# Patient Record
Sex: Male | Born: 1960
Health system: Southern US, Community
[De-identification: ages and names within clinical notes are randomized; demographics above are authoritative.]

---

## 2003-08-03 ENCOUNTER — Inpatient Hospital Stay (HOSPITAL_COMMUNITY): Admission: RE | Admit: 2003-08-03 | Discharge: 2003-08-12 | Payer: Self-pay | Admitting: Psychiatry

## 2011-06-02 DIAGNOSIS — Z23 Encounter for immunization: Secondary | ICD-10-CM | POA: Diagnosis not present

## 2011-06-02 DIAGNOSIS — E039 Hypothyroidism, unspecified: Secondary | ICD-10-CM | POA: Diagnosis not present

## 2011-06-02 DIAGNOSIS — E7800 Pure hypercholesterolemia, unspecified: Secondary | ICD-10-CM | POA: Diagnosis not present

## 2011-06-02 DIAGNOSIS — E119 Type 2 diabetes mellitus without complications: Secondary | ICD-10-CM | POA: Diagnosis not present

## 2011-06-02 DIAGNOSIS — Z6841 Body Mass Index (BMI) 40.0 and over, adult: Secondary | ICD-10-CM | POA: Diagnosis not present

## 2011-06-02 DIAGNOSIS — I10 Essential (primary) hypertension: Secondary | ICD-10-CM | POA: Diagnosis not present

## 2011-06-02 DIAGNOSIS — E781 Pure hyperglyceridemia: Secondary | ICD-10-CM | POA: Diagnosis not present

## 2011-08-02 DIAGNOSIS — E119 Type 2 diabetes mellitus without complications: Secondary | ICD-10-CM | POA: Diagnosis not present

## 2011-08-02 DIAGNOSIS — E781 Pure hyperglyceridemia: Secondary | ICD-10-CM | POA: Diagnosis not present

## 2011-08-02 DIAGNOSIS — E039 Hypothyroidism, unspecified: Secondary | ICD-10-CM | POA: Diagnosis not present

## 2011-08-02 DIAGNOSIS — I10 Essential (primary) hypertension: Secondary | ICD-10-CM | POA: Diagnosis not present

## 2011-10-03 DIAGNOSIS — I10 Essential (primary) hypertension: Secondary | ICD-10-CM | POA: Diagnosis not present

## 2011-10-03 DIAGNOSIS — E039 Hypothyroidism, unspecified: Secondary | ICD-10-CM | POA: Diagnosis not present

## 2011-10-03 DIAGNOSIS — M25519 Pain in unspecified shoulder: Secondary | ICD-10-CM | POA: Diagnosis not present

## 2011-10-03 DIAGNOSIS — E781 Pure hyperglyceridemia: Secondary | ICD-10-CM | POA: Diagnosis not present

## 2011-10-03 DIAGNOSIS — E119 Type 2 diabetes mellitus without complications: Secondary | ICD-10-CM | POA: Diagnosis not present

## 2011-12-13 DIAGNOSIS — E781 Pure hyperglyceridemia: Secondary | ICD-10-CM | POA: Diagnosis not present

## 2011-12-13 DIAGNOSIS — I10 Essential (primary) hypertension: Secondary | ICD-10-CM | POA: Diagnosis not present

## 2011-12-13 DIAGNOSIS — E039 Hypothyroidism, unspecified: Secondary | ICD-10-CM | POA: Diagnosis not present

## 2011-12-13 DIAGNOSIS — E119 Type 2 diabetes mellitus without complications: Secondary | ICD-10-CM | POA: Diagnosis not present

## 2012-02-13 DIAGNOSIS — E119 Type 2 diabetes mellitus without complications: Secondary | ICD-10-CM | POA: Diagnosis not present

## 2012-02-13 DIAGNOSIS — E781 Pure hyperglyceridemia: Secondary | ICD-10-CM | POA: Diagnosis not present

## 2012-02-13 DIAGNOSIS — Z23 Encounter for immunization: Secondary | ICD-10-CM | POA: Diagnosis not present

## 2012-02-13 DIAGNOSIS — Z6841 Body Mass Index (BMI) 40.0 and over, adult: Secondary | ICD-10-CM | POA: Diagnosis not present

## 2012-02-13 DIAGNOSIS — I10 Essential (primary) hypertension: Secondary | ICD-10-CM | POA: Diagnosis not present

## 2012-03-19 DIAGNOSIS — Z0000 Encounter for general adult medical examination without abnormal findings: Secondary | ICD-10-CM | POA: Diagnosis not present

## 2012-03-19 DIAGNOSIS — Z125 Encounter for screening for malignant neoplasm of prostate: Secondary | ICD-10-CM | POA: Diagnosis not present

## 2012-05-21 DIAGNOSIS — I10 Essential (primary) hypertension: Secondary | ICD-10-CM | POA: Diagnosis not present

## 2012-05-21 DIAGNOSIS — E119 Type 2 diabetes mellitus without complications: Secondary | ICD-10-CM | POA: Diagnosis not present

## 2012-05-21 DIAGNOSIS — E781 Pure hyperglyceridemia: Secondary | ICD-10-CM | POA: Diagnosis not present

## 2012-05-21 DIAGNOSIS — Z6841 Body Mass Index (BMI) 40.0 and over, adult: Secondary | ICD-10-CM | POA: Diagnosis not present

## 2012-07-19 DIAGNOSIS — E039 Hypothyroidism, unspecified: Secondary | ICD-10-CM | POA: Diagnosis not present

## 2012-07-19 DIAGNOSIS — E119 Type 2 diabetes mellitus without complications: Secondary | ICD-10-CM | POA: Diagnosis not present

## 2012-07-19 DIAGNOSIS — I10 Essential (primary) hypertension: Secondary | ICD-10-CM | POA: Diagnosis not present

## 2012-07-19 DIAGNOSIS — E781 Pure hyperglyceridemia: Secondary | ICD-10-CM | POA: Diagnosis not present

## 2012-07-19 DIAGNOSIS — Z6841 Body Mass Index (BMI) 40.0 and over, adult: Secondary | ICD-10-CM | POA: Diagnosis not present

## 2012-07-30 DIAGNOSIS — E119 Type 2 diabetes mellitus without complications: Secondary | ICD-10-CM | POA: Diagnosis not present

## 2012-09-25 DIAGNOSIS — I10 Essential (primary) hypertension: Secondary | ICD-10-CM | POA: Diagnosis not present

## 2012-09-25 DIAGNOSIS — E039 Hypothyroidism, unspecified: Secondary | ICD-10-CM | POA: Diagnosis not present

## 2012-09-25 DIAGNOSIS — E781 Pure hyperglyceridemia: Secondary | ICD-10-CM | POA: Diagnosis not present

## 2012-09-25 DIAGNOSIS — Z6841 Body Mass Index (BMI) 40.0 and over, adult: Secondary | ICD-10-CM | POA: Diagnosis not present

## 2012-09-25 DIAGNOSIS — E119 Type 2 diabetes mellitus without complications: Secondary | ICD-10-CM | POA: Diagnosis not present

## 2012-10-03 DIAGNOSIS — I10 Essential (primary) hypertension: Secondary | ICD-10-CM | POA: Diagnosis not present

## 2012-10-03 DIAGNOSIS — E781 Pure hyperglyceridemia: Secondary | ICD-10-CM | POA: Diagnosis not present

## 2012-10-03 DIAGNOSIS — Z6841 Body Mass Index (BMI) 40.0 and over, adult: Secondary | ICD-10-CM | POA: Diagnosis not present

## 2012-10-03 DIAGNOSIS — E119 Type 2 diabetes mellitus without complications: Secondary | ICD-10-CM | POA: Diagnosis not present

## 2012-10-16 DIAGNOSIS — E781 Pure hyperglyceridemia: Secondary | ICD-10-CM | POA: Diagnosis not present

## 2012-10-16 DIAGNOSIS — E119 Type 2 diabetes mellitus without complications: Secondary | ICD-10-CM | POA: Diagnosis not present

## 2012-10-16 DIAGNOSIS — I10 Essential (primary) hypertension: Secondary | ICD-10-CM | POA: Diagnosis not present

## 2012-10-16 DIAGNOSIS — E039 Hypothyroidism, unspecified: Secondary | ICD-10-CM | POA: Diagnosis not present

## 2012-10-29 DIAGNOSIS — I10 Essential (primary) hypertension: Secondary | ICD-10-CM | POA: Diagnosis not present

## 2012-10-29 DIAGNOSIS — Z6841 Body Mass Index (BMI) 40.0 and over, adult: Secondary | ICD-10-CM | POA: Diagnosis not present

## 2012-10-29 DIAGNOSIS — E781 Pure hyperglyceridemia: Secondary | ICD-10-CM | POA: Diagnosis not present

## 2012-10-29 DIAGNOSIS — E119 Type 2 diabetes mellitus without complications: Secondary | ICD-10-CM | POA: Diagnosis not present

## 2012-11-21 DIAGNOSIS — Z6839 Body mass index (BMI) 39.0-39.9, adult: Secondary | ICD-10-CM | POA: Diagnosis not present

## 2012-11-21 DIAGNOSIS — E781 Pure hyperglyceridemia: Secondary | ICD-10-CM | POA: Diagnosis not present

## 2012-11-21 DIAGNOSIS — R197 Diarrhea, unspecified: Secondary | ICD-10-CM | POA: Diagnosis not present

## 2012-11-21 DIAGNOSIS — E119 Type 2 diabetes mellitus without complications: Secondary | ICD-10-CM | POA: Diagnosis not present

## 2012-11-27 DIAGNOSIS — E781 Pure hyperglyceridemia: Secondary | ICD-10-CM | POA: Diagnosis not present

## 2012-11-27 DIAGNOSIS — E119 Type 2 diabetes mellitus without complications: Secondary | ICD-10-CM | POA: Diagnosis not present

## 2012-11-27 DIAGNOSIS — R197 Diarrhea, unspecified: Secondary | ICD-10-CM | POA: Diagnosis not present

## 2012-11-27 DIAGNOSIS — Z6841 Body Mass Index (BMI) 40.0 and over, adult: Secondary | ICD-10-CM | POA: Diagnosis not present

## 2013-01-01 DIAGNOSIS — K5900 Constipation, unspecified: Secondary | ICD-10-CM | POA: Diagnosis not present

## 2013-01-01 DIAGNOSIS — Z6841 Body Mass Index (BMI) 40.0 and over, adult: Secondary | ICD-10-CM | POA: Diagnosis not present

## 2013-01-01 DIAGNOSIS — E781 Pure hyperglyceridemia: Secondary | ICD-10-CM | POA: Diagnosis not present

## 2013-01-01 DIAGNOSIS — E119 Type 2 diabetes mellitus without complications: Secondary | ICD-10-CM | POA: Diagnosis not present

## 2013-01-15 DIAGNOSIS — I10 Essential (primary) hypertension: Secondary | ICD-10-CM | POA: Diagnosis not present

## 2013-01-15 DIAGNOSIS — E039 Hypothyroidism, unspecified: Secondary | ICD-10-CM | POA: Diagnosis not present

## 2013-01-15 DIAGNOSIS — E119 Type 2 diabetes mellitus without complications: Secondary | ICD-10-CM | POA: Diagnosis not present

## 2013-01-15 DIAGNOSIS — Z6841 Body Mass Index (BMI) 40.0 and over, adult: Secondary | ICD-10-CM | POA: Diagnosis not present

## 2013-01-15 DIAGNOSIS — E781 Pure hyperglyceridemia: Secondary | ICD-10-CM | POA: Diagnosis not present

## 2013-02-12 DIAGNOSIS — E781 Pure hyperglyceridemia: Secondary | ICD-10-CM | POA: Diagnosis not present

## 2013-02-12 DIAGNOSIS — E119 Type 2 diabetes mellitus without complications: Secondary | ICD-10-CM | POA: Diagnosis not present

## 2013-02-12 DIAGNOSIS — Z23 Encounter for immunization: Secondary | ICD-10-CM | POA: Diagnosis not present

## 2013-02-12 DIAGNOSIS — Z6841 Body Mass Index (BMI) 40.0 and over, adult: Secondary | ICD-10-CM | POA: Diagnosis not present

## 2013-02-12 DIAGNOSIS — R300 Dysuria: Secondary | ICD-10-CM | POA: Diagnosis not present

## 2013-04-09 DIAGNOSIS — E781 Pure hyperglyceridemia: Secondary | ICD-10-CM | POA: Diagnosis not present

## 2013-04-09 DIAGNOSIS — E119 Type 2 diabetes mellitus without complications: Secondary | ICD-10-CM | POA: Diagnosis not present

## 2013-04-09 DIAGNOSIS — N19 Unspecified kidney failure: Secondary | ICD-10-CM | POA: Diagnosis not present

## 2013-04-09 DIAGNOSIS — I10 Essential (primary) hypertension: Secondary | ICD-10-CM | POA: Diagnosis not present

## 2013-05-27 DIAGNOSIS — F209 Schizophrenia, unspecified: Secondary | ICD-10-CM | POA: Diagnosis not present

## 2013-06-03 DIAGNOSIS — F209 Schizophrenia, unspecified: Secondary | ICD-10-CM | POA: Diagnosis not present

## 2013-06-11 DIAGNOSIS — E781 Pure hyperglyceridemia: Secondary | ICD-10-CM | POA: Diagnosis not present

## 2013-06-11 DIAGNOSIS — E119 Type 2 diabetes mellitus without complications: Secondary | ICD-10-CM | POA: Diagnosis not present

## 2013-06-11 DIAGNOSIS — N19 Unspecified kidney failure: Secondary | ICD-10-CM | POA: Diagnosis not present

## 2013-06-11 DIAGNOSIS — I10 Essential (primary) hypertension: Secondary | ICD-10-CM | POA: Diagnosis not present

## 2013-06-11 DIAGNOSIS — R197 Diarrhea, unspecified: Secondary | ICD-10-CM | POA: Diagnosis not present

## 2013-07-02 DIAGNOSIS — F209 Schizophrenia, unspecified: Secondary | ICD-10-CM | POA: Diagnosis not present

## 2013-07-10 DIAGNOSIS — F209 Schizophrenia, unspecified: Secondary | ICD-10-CM | POA: Diagnosis not present

## 2013-07-16 DIAGNOSIS — E039 Hypothyroidism, unspecified: Secondary | ICD-10-CM | POA: Diagnosis not present

## 2013-07-16 DIAGNOSIS — I10 Essential (primary) hypertension: Secondary | ICD-10-CM | POA: Diagnosis not present

## 2013-07-16 DIAGNOSIS — E119 Type 2 diabetes mellitus without complications: Secondary | ICD-10-CM | POA: Diagnosis not present

## 2013-07-16 DIAGNOSIS — N19 Unspecified kidney failure: Secondary | ICD-10-CM | POA: Diagnosis not present

## 2013-07-16 DIAGNOSIS — Z6841 Body Mass Index (BMI) 40.0 and over, adult: Secondary | ICD-10-CM | POA: Diagnosis not present

## 2013-07-16 DIAGNOSIS — E781 Pure hyperglyceridemia: Secondary | ICD-10-CM | POA: Diagnosis not present

## 2013-07-22 DIAGNOSIS — F209 Schizophrenia, unspecified: Secondary | ICD-10-CM | POA: Diagnosis not present

## 2013-08-05 DIAGNOSIS — E781 Pure hyperglyceridemia: Secondary | ICD-10-CM | POA: Diagnosis not present

## 2013-08-05 DIAGNOSIS — I10 Essential (primary) hypertension: Secondary | ICD-10-CM | POA: Diagnosis not present

## 2013-08-05 DIAGNOSIS — E039 Hypothyroidism, unspecified: Secondary | ICD-10-CM | POA: Diagnosis not present

## 2013-08-05 DIAGNOSIS — N19 Unspecified kidney failure: Secondary | ICD-10-CM | POA: Diagnosis not present

## 2013-08-06 DIAGNOSIS — F209 Schizophrenia, unspecified: Secondary | ICD-10-CM | POA: Diagnosis not present

## 2013-08-13 DIAGNOSIS — F209 Schizophrenia, unspecified: Secondary | ICD-10-CM | POA: Diagnosis not present

## 2013-08-26 DIAGNOSIS — F209 Schizophrenia, unspecified: Secondary | ICD-10-CM | POA: Diagnosis not present

## 2013-09-02 DIAGNOSIS — I10 Essential (primary) hypertension: Secondary | ICD-10-CM | POA: Diagnosis not present

## 2013-09-02 DIAGNOSIS — E119 Type 2 diabetes mellitus without complications: Secondary | ICD-10-CM | POA: Diagnosis not present

## 2013-09-02 DIAGNOSIS — E781 Pure hyperglyceridemia: Secondary | ICD-10-CM | POA: Diagnosis not present

## 2013-09-02 DIAGNOSIS — E039 Hypothyroidism, unspecified: Secondary | ICD-10-CM | POA: Diagnosis not present

## 2013-09-24 DIAGNOSIS — F209 Schizophrenia, unspecified: Secondary | ICD-10-CM | POA: Diagnosis not present

## 2013-10-03 DIAGNOSIS — M25519 Pain in unspecified shoulder: Secondary | ICD-10-CM | POA: Diagnosis not present

## 2013-10-03 DIAGNOSIS — E119 Type 2 diabetes mellitus without complications: Secondary | ICD-10-CM | POA: Diagnosis not present

## 2013-10-03 DIAGNOSIS — E781 Pure hyperglyceridemia: Secondary | ICD-10-CM | POA: Diagnosis not present

## 2013-10-03 DIAGNOSIS — E039 Hypothyroidism, unspecified: Secondary | ICD-10-CM | POA: Diagnosis not present

## 2013-10-03 DIAGNOSIS — I10 Essential (primary) hypertension: Secondary | ICD-10-CM | POA: Diagnosis not present

## 2013-10-03 DIAGNOSIS — Z6841 Body Mass Index (BMI) 40.0 and over, adult: Secondary | ICD-10-CM | POA: Diagnosis not present

## 2013-10-22 DIAGNOSIS — F209 Schizophrenia, unspecified: Secondary | ICD-10-CM | POA: Diagnosis not present

## 2013-11-04 DIAGNOSIS — E119 Type 2 diabetes mellitus without complications: Secondary | ICD-10-CM | POA: Diagnosis not present

## 2013-11-04 DIAGNOSIS — E039 Hypothyroidism, unspecified: Secondary | ICD-10-CM | POA: Diagnosis not present

## 2013-11-04 DIAGNOSIS — E781 Pure hyperglyceridemia: Secondary | ICD-10-CM | POA: Diagnosis not present

## 2013-11-04 DIAGNOSIS — I10 Essential (primary) hypertension: Secondary | ICD-10-CM | POA: Diagnosis not present

## 2013-12-16 DIAGNOSIS — I10 Essential (primary) hypertension: Secondary | ICD-10-CM | POA: Diagnosis not present

## 2013-12-16 DIAGNOSIS — E119 Type 2 diabetes mellitus without complications: Secondary | ICD-10-CM | POA: Diagnosis not present

## 2013-12-16 DIAGNOSIS — E781 Pure hyperglyceridemia: Secondary | ICD-10-CM | POA: Diagnosis not present

## 2013-12-16 DIAGNOSIS — E039 Hypothyroidism, unspecified: Secondary | ICD-10-CM | POA: Diagnosis not present

## 2013-12-17 DIAGNOSIS — F209 Schizophrenia, unspecified: Secondary | ICD-10-CM | POA: Diagnosis not present

## 2014-01-27 DIAGNOSIS — E119 Type 2 diabetes mellitus without complications: Secondary | ICD-10-CM | POA: Diagnosis not present

## 2014-01-27 DIAGNOSIS — E039 Hypothyroidism, unspecified: Secondary | ICD-10-CM | POA: Diagnosis not present

## 2014-01-27 DIAGNOSIS — I10 Essential (primary) hypertension: Secondary | ICD-10-CM | POA: Diagnosis not present

## 2014-01-27 DIAGNOSIS — E7800 Pure hypercholesterolemia, unspecified: Secondary | ICD-10-CM | POA: Diagnosis not present

## 2014-01-27 DIAGNOSIS — Z23 Encounter for immunization: Secondary | ICD-10-CM | POA: Diagnosis not present

## 2014-02-17 DIAGNOSIS — F209 Schizophrenia, unspecified: Secondary | ICD-10-CM | POA: Diagnosis not present

## 2014-03-10 DIAGNOSIS — E119 Type 2 diabetes mellitus without complications: Secondary | ICD-10-CM | POA: Diagnosis not present

## 2014-03-10 DIAGNOSIS — I10 Essential (primary) hypertension: Secondary | ICD-10-CM | POA: Diagnosis not present

## 2014-03-10 DIAGNOSIS — Z6841 Body Mass Index (BMI) 40.0 and over, adult: Secondary | ICD-10-CM | POA: Diagnosis not present

## 2014-03-10 DIAGNOSIS — N289 Disorder of kidney and ureter, unspecified: Secondary | ICD-10-CM | POA: Diagnosis not present

## 2014-03-10 DIAGNOSIS — K5909 Other constipation: Secondary | ICD-10-CM | POA: Diagnosis not present

## 2014-03-10 DIAGNOSIS — E781 Pure hyperglyceridemia: Secondary | ICD-10-CM | POA: Diagnosis not present

## 2014-03-10 DIAGNOSIS — E039 Hypothyroidism, unspecified: Secondary | ICD-10-CM | POA: Diagnosis not present

## 2014-03-24 DIAGNOSIS — F209 Schizophrenia, unspecified: Secondary | ICD-10-CM | POA: Diagnosis not present

## 2014-04-10 DIAGNOSIS — E119 Type 2 diabetes mellitus without complications: Secondary | ICD-10-CM | POA: Diagnosis not present

## 2014-04-10 DIAGNOSIS — I10 Essential (primary) hypertension: Secondary | ICD-10-CM | POA: Diagnosis not present

## 2014-04-10 DIAGNOSIS — M25519 Pain in unspecified shoulder: Secondary | ICD-10-CM | POA: Diagnosis not present

## 2014-04-10 DIAGNOSIS — K5909 Other constipation: Secondary | ICD-10-CM | POA: Diagnosis not present

## 2014-04-10 DIAGNOSIS — E781 Pure hyperglyceridemia: Secondary | ICD-10-CM | POA: Diagnosis not present

## 2014-04-10 DIAGNOSIS — E039 Hypothyroidism, unspecified: Secondary | ICD-10-CM | POA: Diagnosis not present

## 2014-04-10 DIAGNOSIS — N289 Disorder of kidney and ureter, unspecified: Secondary | ICD-10-CM | POA: Diagnosis not present

## 2014-05-13 DIAGNOSIS — N289 Disorder of kidney and ureter, unspecified: Secondary | ICD-10-CM | POA: Diagnosis not present

## 2014-05-13 DIAGNOSIS — E781 Pure hyperglyceridemia: Secondary | ICD-10-CM | POA: Diagnosis not present

## 2014-05-13 DIAGNOSIS — I10 Essential (primary) hypertension: Secondary | ICD-10-CM | POA: Diagnosis not present

## 2014-05-13 DIAGNOSIS — E039 Hypothyroidism, unspecified: Secondary | ICD-10-CM | POA: Diagnosis not present

## 2014-05-13 DIAGNOSIS — Z6841 Body Mass Index (BMI) 40.0 and over, adult: Secondary | ICD-10-CM | POA: Diagnosis not present

## 2014-05-13 DIAGNOSIS — E119 Type 2 diabetes mellitus without complications: Secondary | ICD-10-CM | POA: Diagnosis not present

## 2014-05-13 DIAGNOSIS — K5909 Other constipation: Secondary | ICD-10-CM | POA: Diagnosis not present

## 2014-06-10 DIAGNOSIS — E119 Type 2 diabetes mellitus without complications: Secondary | ICD-10-CM | POA: Diagnosis not present

## 2014-06-10 DIAGNOSIS — E781 Pure hyperglyceridemia: Secondary | ICD-10-CM | POA: Diagnosis not present

## 2014-06-10 DIAGNOSIS — E039 Hypothyroidism, unspecified: Secondary | ICD-10-CM | POA: Diagnosis not present

## 2014-06-10 DIAGNOSIS — M25519 Pain in unspecified shoulder: Secondary | ICD-10-CM | POA: Diagnosis not present

## 2014-06-10 DIAGNOSIS — I10 Essential (primary) hypertension: Secondary | ICD-10-CM | POA: Diagnosis not present

## 2014-06-10 DIAGNOSIS — K5909 Other constipation: Secondary | ICD-10-CM | POA: Diagnosis not present

## 2014-06-10 DIAGNOSIS — Z6841 Body Mass Index (BMI) 40.0 and over, adult: Secondary | ICD-10-CM | POA: Diagnosis not present

## 2014-06-10 DIAGNOSIS — N289 Disorder of kidney and ureter, unspecified: Secondary | ICD-10-CM | POA: Diagnosis not present

## 2014-06-23 DIAGNOSIS — F209 Schizophrenia, unspecified: Secondary | ICD-10-CM | POA: Diagnosis not present

## 2014-07-21 DIAGNOSIS — F209 Schizophrenia, unspecified: Secondary | ICD-10-CM | POA: Diagnosis not present

## 2014-08-04 DIAGNOSIS — I10 Essential (primary) hypertension: Secondary | ICD-10-CM | POA: Diagnosis not present

## 2014-08-04 DIAGNOSIS — N289 Disorder of kidney and ureter, unspecified: Secondary | ICD-10-CM | POA: Diagnosis not present

## 2014-08-04 DIAGNOSIS — E781 Pure hyperglyceridemia: Secondary | ICD-10-CM | POA: Diagnosis not present

## 2014-08-04 DIAGNOSIS — E039 Hypothyroidism, unspecified: Secondary | ICD-10-CM | POA: Diagnosis not present

## 2014-08-04 DIAGNOSIS — E119 Type 2 diabetes mellitus without complications: Secondary | ICD-10-CM | POA: Diagnosis not present

## 2014-08-04 DIAGNOSIS — K5909 Other constipation: Secondary | ICD-10-CM | POA: Diagnosis not present

## 2014-08-04 DIAGNOSIS — Z6841 Body Mass Index (BMI) 40.0 and over, adult: Secondary | ICD-10-CM | POA: Diagnosis not present

## 2014-09-01 DIAGNOSIS — E781 Pure hyperglyceridemia: Secondary | ICD-10-CM | POA: Diagnosis not present

## 2014-09-01 DIAGNOSIS — K5909 Other constipation: Secondary | ICD-10-CM | POA: Diagnosis not present

## 2014-09-01 DIAGNOSIS — Z9181 History of falling: Secondary | ICD-10-CM | POA: Diagnosis not present

## 2014-09-01 DIAGNOSIS — E039 Hypothyroidism, unspecified: Secondary | ICD-10-CM | POA: Diagnosis not present

## 2014-09-01 DIAGNOSIS — I10 Essential (primary) hypertension: Secondary | ICD-10-CM | POA: Diagnosis not present

## 2014-09-01 DIAGNOSIS — E119 Type 2 diabetes mellitus without complications: Secondary | ICD-10-CM | POA: Diagnosis not present

## 2014-09-01 DIAGNOSIS — Z6841 Body Mass Index (BMI) 40.0 and over, adult: Secondary | ICD-10-CM | POA: Diagnosis not present

## 2014-09-01 DIAGNOSIS — Z1389 Encounter for screening for other disorder: Secondary | ICD-10-CM | POA: Diagnosis not present

## 2014-09-01 DIAGNOSIS — N289 Disorder of kidney and ureter, unspecified: Secondary | ICD-10-CM | POA: Diagnosis not present

## 2014-09-22 DIAGNOSIS — F209 Schizophrenia, unspecified: Secondary | ICD-10-CM | POA: Diagnosis not present

## 2014-10-02 DIAGNOSIS — E039 Hypothyroidism, unspecified: Secondary | ICD-10-CM | POA: Diagnosis not present

## 2014-10-02 DIAGNOSIS — K5909 Other constipation: Secondary | ICD-10-CM | POA: Diagnosis not present

## 2014-10-02 DIAGNOSIS — E781 Pure hyperglyceridemia: Secondary | ICD-10-CM | POA: Diagnosis not present

## 2014-10-02 DIAGNOSIS — I10 Essential (primary) hypertension: Secondary | ICD-10-CM | POA: Diagnosis not present

## 2014-10-02 DIAGNOSIS — N289 Disorder of kidney and ureter, unspecified: Secondary | ICD-10-CM | POA: Diagnosis not present

## 2014-10-02 DIAGNOSIS — E119 Type 2 diabetes mellitus without complications: Secondary | ICD-10-CM | POA: Diagnosis not present

## 2014-10-02 DIAGNOSIS — Z6841 Body Mass Index (BMI) 40.0 and over, adult: Secondary | ICD-10-CM | POA: Diagnosis not present

## 2014-11-24 DIAGNOSIS — F209 Schizophrenia, unspecified: Secondary | ICD-10-CM | POA: Diagnosis not present

## 2014-12-02 DIAGNOSIS — K5909 Other constipation: Secondary | ICD-10-CM | POA: Diagnosis not present

## 2014-12-02 DIAGNOSIS — E039 Hypothyroidism, unspecified: Secondary | ICD-10-CM | POA: Diagnosis not present

## 2014-12-02 DIAGNOSIS — E781 Pure hyperglyceridemia: Secondary | ICD-10-CM | POA: Diagnosis not present

## 2014-12-02 DIAGNOSIS — E119 Type 2 diabetes mellitus without complications: Secondary | ICD-10-CM | POA: Diagnosis not present

## 2014-12-02 DIAGNOSIS — N289 Disorder of kidney and ureter, unspecified: Secondary | ICD-10-CM | POA: Diagnosis not present

## 2014-12-02 DIAGNOSIS — Z6841 Body Mass Index (BMI) 40.0 and over, adult: Secondary | ICD-10-CM | POA: Diagnosis not present

## 2014-12-02 DIAGNOSIS — I10 Essential (primary) hypertension: Secondary | ICD-10-CM | POA: Diagnosis not present

## 2015-01-05 DIAGNOSIS — I10 Essential (primary) hypertension: Secondary | ICD-10-CM | POA: Diagnosis not present

## 2015-01-05 DIAGNOSIS — E781 Pure hyperglyceridemia: Secondary | ICD-10-CM | POA: Diagnosis not present

## 2015-01-05 DIAGNOSIS — K5909 Other constipation: Secondary | ICD-10-CM | POA: Diagnosis not present

## 2015-01-05 DIAGNOSIS — E119 Type 2 diabetes mellitus without complications: Secondary | ICD-10-CM | POA: Diagnosis not present

## 2015-01-05 DIAGNOSIS — Z6841 Body Mass Index (BMI) 40.0 and over, adult: Secondary | ICD-10-CM | POA: Diagnosis not present

## 2015-01-05 DIAGNOSIS — N289 Disorder of kidney and ureter, unspecified: Secondary | ICD-10-CM | POA: Diagnosis not present

## 2015-01-05 DIAGNOSIS — E039 Hypothyroidism, unspecified: Secondary | ICD-10-CM | POA: Diagnosis not present

## 2015-01-07 DIAGNOSIS — E1165 Type 2 diabetes mellitus with hyperglycemia: Secondary | ICD-10-CM | POA: Diagnosis not present

## 2015-01-08 DIAGNOSIS — E1165 Type 2 diabetes mellitus with hyperglycemia: Secondary | ICD-10-CM | POA: Diagnosis not present

## 2015-01-08 DIAGNOSIS — R531 Weakness: Secondary | ICD-10-CM | POA: Diagnosis not present

## 2015-01-13 DIAGNOSIS — F209 Schizophrenia, unspecified: Secondary | ICD-10-CM | POA: Diagnosis not present

## 2015-01-20 DIAGNOSIS — E1165 Type 2 diabetes mellitus with hyperglycemia: Secondary | ICD-10-CM | POA: Diagnosis not present

## 2015-01-27 DIAGNOSIS — N289 Disorder of kidney and ureter, unspecified: Secondary | ICD-10-CM | POA: Diagnosis not present

## 2015-01-27 DIAGNOSIS — I10 Essential (primary) hypertension: Secondary | ICD-10-CM | POA: Diagnosis not present

## 2015-01-27 DIAGNOSIS — Z23 Encounter for immunization: Secondary | ICD-10-CM | POA: Diagnosis not present

## 2015-01-27 DIAGNOSIS — E781 Pure hyperglyceridemia: Secondary | ICD-10-CM | POA: Diagnosis not present

## 2015-01-27 DIAGNOSIS — K5909 Other constipation: Secondary | ICD-10-CM | POA: Diagnosis not present

## 2015-01-27 DIAGNOSIS — E119 Type 2 diabetes mellitus without complications: Secondary | ICD-10-CM | POA: Diagnosis not present

## 2015-01-27 DIAGNOSIS — E039 Hypothyroidism, unspecified: Secondary | ICD-10-CM | POA: Diagnosis not present

## 2015-01-27 DIAGNOSIS — Z6839 Body mass index (BMI) 39.0-39.9, adult: Secondary | ICD-10-CM | POA: Diagnosis not present

## 2015-02-03 DIAGNOSIS — E039 Hypothyroidism, unspecified: Secondary | ICD-10-CM | POA: Diagnosis not present

## 2015-02-03 DIAGNOSIS — N289 Disorder of kidney and ureter, unspecified: Secondary | ICD-10-CM | POA: Diagnosis not present

## 2015-02-03 DIAGNOSIS — E781 Pure hyperglyceridemia: Secondary | ICD-10-CM | POA: Diagnosis not present

## 2015-02-03 DIAGNOSIS — I10 Essential (primary) hypertension: Secondary | ICD-10-CM | POA: Diagnosis not present

## 2015-02-03 DIAGNOSIS — Z6839 Body mass index (BMI) 39.0-39.9, adult: Secondary | ICD-10-CM | POA: Diagnosis not present

## 2015-02-03 DIAGNOSIS — E119 Type 2 diabetes mellitus without complications: Secondary | ICD-10-CM | POA: Diagnosis not present

## 2015-02-03 DIAGNOSIS — K5909 Other constipation: Secondary | ICD-10-CM | POA: Diagnosis not present

## 2015-02-10 DIAGNOSIS — K5909 Other constipation: Secondary | ICD-10-CM | POA: Diagnosis not present

## 2015-02-10 DIAGNOSIS — Z6838 Body mass index (BMI) 38.0-38.9, adult: Secondary | ICD-10-CM | POA: Diagnosis not present

## 2015-02-10 DIAGNOSIS — I10 Essential (primary) hypertension: Secondary | ICD-10-CM | POA: Diagnosis not present

## 2015-02-10 DIAGNOSIS — E039 Hypothyroidism, unspecified: Secondary | ICD-10-CM | POA: Diagnosis not present

## 2015-02-10 DIAGNOSIS — E781 Pure hyperglyceridemia: Secondary | ICD-10-CM | POA: Diagnosis not present

## 2015-02-10 DIAGNOSIS — N289 Disorder of kidney and ureter, unspecified: Secondary | ICD-10-CM | POA: Diagnosis not present

## 2015-02-10 DIAGNOSIS — E119 Type 2 diabetes mellitus without complications: Secondary | ICD-10-CM | POA: Diagnosis not present

## 2015-02-24 DIAGNOSIS — Z6837 Body mass index (BMI) 37.0-37.9, adult: Secondary | ICD-10-CM | POA: Diagnosis not present

## 2015-02-24 DIAGNOSIS — E039 Hypothyroidism, unspecified: Secondary | ICD-10-CM | POA: Diagnosis not present

## 2015-02-24 DIAGNOSIS — N289 Disorder of kidney and ureter, unspecified: Secondary | ICD-10-CM | POA: Diagnosis not present

## 2015-02-24 DIAGNOSIS — E781 Pure hyperglyceridemia: Secondary | ICD-10-CM | POA: Diagnosis not present

## 2015-02-24 DIAGNOSIS — I10 Essential (primary) hypertension: Secondary | ICD-10-CM | POA: Diagnosis not present

## 2015-02-24 DIAGNOSIS — K5909 Other constipation: Secondary | ICD-10-CM | POA: Diagnosis not present

## 2015-02-24 DIAGNOSIS — E119 Type 2 diabetes mellitus without complications: Secondary | ICD-10-CM | POA: Diagnosis not present

## 2015-03-16 DIAGNOSIS — F209 Schizophrenia, unspecified: Secondary | ICD-10-CM | POA: Diagnosis not present

## 2015-03-23 DIAGNOSIS — E119 Type 2 diabetes mellitus without complications: Secondary | ICD-10-CM | POA: Diagnosis not present

## 2015-03-23 DIAGNOSIS — K5909 Other constipation: Secondary | ICD-10-CM | POA: Diagnosis not present

## 2015-03-23 DIAGNOSIS — N289 Disorder of kidney and ureter, unspecified: Secondary | ICD-10-CM | POA: Diagnosis not present

## 2015-03-23 DIAGNOSIS — Z1389 Encounter for screening for other disorder: Secondary | ICD-10-CM | POA: Diagnosis not present

## 2015-03-23 DIAGNOSIS — Z6837 Body mass index (BMI) 37.0-37.9, adult: Secondary | ICD-10-CM | POA: Diagnosis not present

## 2015-03-23 DIAGNOSIS — I10 Essential (primary) hypertension: Secondary | ICD-10-CM | POA: Diagnosis not present

## 2015-03-23 DIAGNOSIS — E039 Hypothyroidism, unspecified: Secondary | ICD-10-CM | POA: Diagnosis not present

## 2015-03-23 DIAGNOSIS — E781 Pure hyperglyceridemia: Secondary | ICD-10-CM | POA: Diagnosis not present

## 2015-04-20 DIAGNOSIS — N289 Disorder of kidney and ureter, unspecified: Secondary | ICD-10-CM | POA: Diagnosis not present

## 2015-04-20 DIAGNOSIS — E039 Hypothyroidism, unspecified: Secondary | ICD-10-CM | POA: Diagnosis not present

## 2015-04-20 DIAGNOSIS — E781 Pure hyperglyceridemia: Secondary | ICD-10-CM | POA: Diagnosis not present

## 2015-04-20 DIAGNOSIS — K5909 Other constipation: Secondary | ICD-10-CM | POA: Diagnosis not present

## 2015-04-20 DIAGNOSIS — Z6837 Body mass index (BMI) 37.0-37.9, adult: Secondary | ICD-10-CM | POA: Diagnosis not present

## 2015-04-20 DIAGNOSIS — I10 Essential (primary) hypertension: Secondary | ICD-10-CM | POA: Diagnosis not present

## 2015-04-20 DIAGNOSIS — E119 Type 2 diabetes mellitus without complications: Secondary | ICD-10-CM | POA: Diagnosis not present

## 2015-05-04 DIAGNOSIS — F209 Schizophrenia, unspecified: Secondary | ICD-10-CM | POA: Diagnosis not present

## 2015-05-18 DIAGNOSIS — I10 Essential (primary) hypertension: Secondary | ICD-10-CM | POA: Diagnosis not present

## 2015-05-18 DIAGNOSIS — N289 Disorder of kidney and ureter, unspecified: Secondary | ICD-10-CM | POA: Diagnosis not present

## 2015-05-18 DIAGNOSIS — E781 Pure hyperglyceridemia: Secondary | ICD-10-CM | POA: Diagnosis not present

## 2015-05-18 DIAGNOSIS — E119 Type 2 diabetes mellitus without complications: Secondary | ICD-10-CM | POA: Diagnosis not present

## 2015-05-18 DIAGNOSIS — K5909 Other constipation: Secondary | ICD-10-CM | POA: Diagnosis not present

## 2015-05-18 DIAGNOSIS — Z6838 Body mass index (BMI) 38.0-38.9, adult: Secondary | ICD-10-CM | POA: Diagnosis not present

## 2015-05-18 DIAGNOSIS — E039 Hypothyroidism, unspecified: Secondary | ICD-10-CM | POA: Diagnosis not present

## 2015-06-15 DIAGNOSIS — I10 Essential (primary) hypertension: Secondary | ICD-10-CM | POA: Diagnosis not present

## 2015-06-15 DIAGNOSIS — E781 Pure hyperglyceridemia: Secondary | ICD-10-CM | POA: Diagnosis not present

## 2015-06-15 DIAGNOSIS — E119 Type 2 diabetes mellitus without complications: Secondary | ICD-10-CM | POA: Diagnosis not present

## 2015-06-15 DIAGNOSIS — Z6838 Body mass index (BMI) 38.0-38.9, adult: Secondary | ICD-10-CM | POA: Diagnosis not present

## 2015-06-15 DIAGNOSIS — E039 Hypothyroidism, unspecified: Secondary | ICD-10-CM | POA: Diagnosis not present

## 2015-06-15 DIAGNOSIS — N289 Disorder of kidney and ureter, unspecified: Secondary | ICD-10-CM | POA: Diagnosis not present

## 2015-07-06 DIAGNOSIS — F209 Schizophrenia, unspecified: Secondary | ICD-10-CM | POA: Diagnosis not present

## 2015-07-13 DIAGNOSIS — N289 Disorder of kidney and ureter, unspecified: Secondary | ICD-10-CM | POA: Diagnosis not present

## 2015-07-13 DIAGNOSIS — Z6838 Body mass index (BMI) 38.0-38.9, adult: Secondary | ICD-10-CM | POA: Diagnosis not present

## 2015-07-13 DIAGNOSIS — I10 Essential (primary) hypertension: Secondary | ICD-10-CM | POA: Diagnosis not present

## 2015-07-13 DIAGNOSIS — E781 Pure hyperglyceridemia: Secondary | ICD-10-CM | POA: Diagnosis not present

## 2015-07-13 DIAGNOSIS — E039 Hypothyroidism, unspecified: Secondary | ICD-10-CM | POA: Diagnosis not present

## 2015-07-13 DIAGNOSIS — E119 Type 2 diabetes mellitus without complications: Secondary | ICD-10-CM | POA: Diagnosis not present

## 2015-07-13 DIAGNOSIS — K5909 Other constipation: Secondary | ICD-10-CM | POA: Diagnosis not present

## 2015-07-15 DIAGNOSIS — E119 Type 2 diabetes mellitus without complications: Secondary | ICD-10-CM | POA: Diagnosis not present

## 2015-08-10 DIAGNOSIS — E119 Type 2 diabetes mellitus without complications: Secondary | ICD-10-CM | POA: Diagnosis not present

## 2015-08-10 DIAGNOSIS — E781 Pure hyperglyceridemia: Secondary | ICD-10-CM | POA: Diagnosis not present

## 2015-08-10 DIAGNOSIS — K5909 Other constipation: Secondary | ICD-10-CM | POA: Diagnosis not present

## 2015-08-10 DIAGNOSIS — E039 Hypothyroidism, unspecified: Secondary | ICD-10-CM | POA: Diagnosis not present

## 2015-08-10 DIAGNOSIS — Z6839 Body mass index (BMI) 39.0-39.9, adult: Secondary | ICD-10-CM | POA: Diagnosis not present

## 2015-08-10 DIAGNOSIS — N289 Disorder of kidney and ureter, unspecified: Secondary | ICD-10-CM | POA: Diagnosis not present

## 2015-08-10 DIAGNOSIS — I10 Essential (primary) hypertension: Secondary | ICD-10-CM | POA: Diagnosis not present

## 2015-09-10 DIAGNOSIS — I10 Essential (primary) hypertension: Secondary | ICD-10-CM | POA: Diagnosis not present

## 2015-09-10 DIAGNOSIS — N289 Disorder of kidney and ureter, unspecified: Secondary | ICD-10-CM | POA: Diagnosis not present

## 2015-09-10 DIAGNOSIS — E781 Pure hyperglyceridemia: Secondary | ICD-10-CM | POA: Diagnosis not present

## 2015-09-10 DIAGNOSIS — E669 Obesity, unspecified: Secondary | ICD-10-CM | POA: Diagnosis not present

## 2015-09-10 DIAGNOSIS — Z6839 Body mass index (BMI) 39.0-39.9, adult: Secondary | ICD-10-CM | POA: Diagnosis not present

## 2015-09-10 DIAGNOSIS — E039 Hypothyroidism, unspecified: Secondary | ICD-10-CM | POA: Diagnosis not present

## 2015-09-10 DIAGNOSIS — E119 Type 2 diabetes mellitus without complications: Secondary | ICD-10-CM | POA: Diagnosis not present

## 2015-09-10 DIAGNOSIS — K5909 Other constipation: Secondary | ICD-10-CM | POA: Diagnosis not present

## 2015-10-12 DIAGNOSIS — K5909 Other constipation: Secondary | ICD-10-CM | POA: Diagnosis not present

## 2015-10-12 DIAGNOSIS — E039 Hypothyroidism, unspecified: Secondary | ICD-10-CM | POA: Diagnosis not present

## 2015-10-12 DIAGNOSIS — N289 Disorder of kidney and ureter, unspecified: Secondary | ICD-10-CM | POA: Diagnosis not present

## 2015-10-12 DIAGNOSIS — I10 Essential (primary) hypertension: Secondary | ICD-10-CM | POA: Diagnosis not present

## 2015-10-12 DIAGNOSIS — E781 Pure hyperglyceridemia: Secondary | ICD-10-CM | POA: Diagnosis not present

## 2015-10-12 DIAGNOSIS — E119 Type 2 diabetes mellitus without complications: Secondary | ICD-10-CM | POA: Diagnosis not present

## 2015-10-12 DIAGNOSIS — Z6841 Body Mass Index (BMI) 40.0 and over, adult: Secondary | ICD-10-CM | POA: Diagnosis not present

## 2015-11-16 DIAGNOSIS — F209 Schizophrenia, unspecified: Secondary | ICD-10-CM | POA: Diagnosis not present

## 2015-11-19 DIAGNOSIS — M25519 Pain in unspecified shoulder: Secondary | ICD-10-CM | POA: Diagnosis not present

## 2015-11-19 DIAGNOSIS — E119 Type 2 diabetes mellitus without complications: Secondary | ICD-10-CM | POA: Diagnosis not present

## 2015-11-19 DIAGNOSIS — K5909 Other constipation: Secondary | ICD-10-CM | POA: Diagnosis not present

## 2015-11-19 DIAGNOSIS — N289 Disorder of kidney and ureter, unspecified: Secondary | ICD-10-CM | POA: Diagnosis not present

## 2015-11-19 DIAGNOSIS — I10 Essential (primary) hypertension: Secondary | ICD-10-CM | POA: Diagnosis not present

## 2015-11-19 DIAGNOSIS — E039 Hypothyroidism, unspecified: Secondary | ICD-10-CM | POA: Diagnosis not present

## 2015-11-19 DIAGNOSIS — Z6841 Body Mass Index (BMI) 40.0 and over, adult: Secondary | ICD-10-CM | POA: Diagnosis not present

## 2015-11-19 DIAGNOSIS — E781 Pure hyperglyceridemia: Secondary | ICD-10-CM | POA: Diagnosis not present

## 2015-11-23 ENCOUNTER — Encounter: Payer: Self-pay | Admitting: Family Medicine

## 2015-11-23 ENCOUNTER — Ambulatory Visit
Admission: RE | Admit: 2015-11-23 | Discharge: 2015-11-23 | Disposition: A | Discharge status: Home / Self Care | Payer: Medicare Other | Source: Ambulatory Visit | Attending: Sports Medicine | Admitting: Sports Medicine

## 2015-11-23 ENCOUNTER — Ambulatory Visit (INDEPENDENT_AMBULATORY_CARE_PROVIDER_SITE_OTHER): Payer: Medicare Other | Admitting: Family Medicine

## 2015-11-23 VITALS — BP 141/86 | HR 93 | Ht 69.0 in | Wt 275.0 lb

## 2015-11-23 DIAGNOSIS — M25511 Pain in right shoulder: Secondary | ICD-10-CM | POA: Diagnosis present

## 2015-11-23 DIAGNOSIS — M19011 Primary osteoarthritis, right shoulder: Secondary | ICD-10-CM | POA: Diagnosis not present

## 2015-11-23 MED ORDER — DICLOFENAC SODIUM 75 MG PO TBEC: 75.0000 mg | DELAYED_RELEASE_TABLET | Freq: Two times a day (BID) | ORAL | 2 refills | Status: DC

## 2015-11-23 NOTE — Patient Instructions (Signed)
Thank you for coming in,   Please take the voltaren on a twice a day, every day for one week then as needed after that.   DO not take aleve with the voltaren.   I will call with the results from today.   Please follow up with me in 3-4 weeks if there is no improvement.    Please feel free to call with any questions or concerns at any time, at [phone]. --Dr. Jordan Likes

## 2015-11-23 NOTE — Progress Notes (Signed)
Philip Mcintyre Juste - 55 y.o. male MRN [id number]  Date of birth: 19-Dec-1960  SUBJECTIVE:  Including CC & ROS.  Chief Complaint  Patient presents with  . Shoulder Pain    Mr. Seddon is a 55 year old male that is presenting with right shoulder pain. This is acute on chronic in nature. He initially had pain about 4-5 years ago while he was bench pressing. The pain occurred while he was bringing the bar toward his chest. Since that time he has taken Aleve periodically to help with the pain. He tends not to have pain when not lifting. He will have pain after bench pressing the day or two after. The pain occurs on the anterior/proximal aspect of his right shoulder. The pain is rated as 9 out of 10. It is sharp in nature. He denies any prior injuries to shoulder. The pain radiates down the posterior aspect of his right arm and towards his back some. He denies any numbness or tingling. He only seems to notice it when he is lifting weights. He reports receiving an injection from his primary care doctor a week ago but no improvement in his symptoms. He is unable to report what was injected in his right shoulder. He has been bench pressing since she was 55 years old. He was a wrestler in high school. He is left-handed.   ROS: No unexpected weight loss, feer, chills, swelling, instability, numbness/tingling, redness, otherwise see HPI   HISTORY: Past Medical, Surgical, Social, and Family History Reviewed & Updated per EMR.   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, hernia repair in high school  PSHx -  No tobacco, former alcohol use, lives in [place], [place] FHx -  Mental illness runs in the family, dm2  Medications - lipitor, cogentin, celexa, klonopin, gabapentin, amaryl, synthroid, trileptal, seroquel, tradjenta   DATA REVIEWED: None to review   PHYSICAL EXAM:  VS: BP:(!) 141/86  HR:93bpm  TEMP: ( )  RESP:   HT:5\' 9"  (175.3 cm)    WT:275 lb (124.7 kg)  BMI:40.7 PHYSICAL EXAM: Gen: NAD, alert, cooperative with exam, well-appearing HEENT: clear conjunctiva,  CV:  no edema, capillary refill brisk, normal rate Resp: non-labored Skin: no rashes, normal turgor  Neuro: no gross deficits.  Psych:  alert and oriented Shoulder: Inspection reveals that his right shoulder and trapezius are at a higher position compared to left Posture is held in a concave manner Palpation is normal with no tenderness over AC joint or bicipital groove. Limited range of motion with internal to about 5 and external rotation to 20 Full abduction and flexion Normal internal and external strength to resistance. Normal full and empty can test. Normal Napoleon and BAER test. Some suggestion of impingement with Hawkins test.. Speeds and Yergason's tests normal. No labral pathology noted with negative Obrien's, negative clunk and good stability. Normal scapular function observed. No painful arc and no drop arm sign. No apprehension sign Neurovascularly intact  Limited ultrasound: right shoulder: Acromioclavicular joint showing some irregularity and geyser sign. Infraspinatus and teres minor reviewed and long and short axis and appeared to be normal. Supraspinatus was viewed and long and short axis with no suggestion of partial or complete tear. Unable to view subscapularis. Biceps tendon intact and viewed and short axis.   ASSESSMENT & PLAN:   Right shoulder pain Long history of bench pressing and shoulder pain would suggest acromioclavicular joint pain. Exam does not suggest it is true [facility] joint pain.  Reports an injection into his shoulder but is unsure where (BT vs AC joint vs subacromial). Possible for biceps tendinitis but ultrasound did not suggest a target sign. - We'll obtain right shoulder x-ray. I will call with the results. - Prescribed Voltaren 75 mg twice a day to take instead of the differing amounts of Aleve he was taking.  Advised to take no Aleve with the Voltaren - Provided with scapular stabilization exercises to help bring his posture into a more neutral position. - Advised to call his primary care and obtain office notes relating to his steroid injection to bring with him next time - Encouraged to follow-up in 3 weeks if still having pain or no improvement.

## 2015-11-23 NOTE — Assessment & Plan Note (Addendum)
Long history of bench pressing and shoulder pain would suggest acromioclavicular joint pain. Exam does not suggest it is true [facility] joint pain. Reports an injection into his shoulder but is unsure where (BT vs AC joint vs subacromial). Possible for biceps tendinitis but ultrasound did not suggest a target sign. - We'll obtain right shoulder x-ray. I will call with the results. - Prescribed Voltaren 75 mg twice a day to take instead of the differing amounts of Aleve he was taking. Advised to take no Aleve with the Voltaren - Provided with scapular stabilization exercises to help bring his posture into a more neutral position. - Advised to call his primary care and obtain office notes relating to his steroid injection to bring with him next time - Encouraged to follow-up in 3 weeks if still having pain or no improvement.

## 2015-11-24 ENCOUNTER — Telehealth: Payer: Self-pay | Admitting: Family Medicine

## 2015-11-24 NOTE — Telephone Encounter (Signed)
Spoke with patient about his x-rays. He will continue taking the prescribed medication and performing the exercises that were provided.  Myra Rude, MD PGY-4, [facility] 11/24/2015, 8:50 AM

## 2015-12-14 ENCOUNTER — Encounter: Payer: Self-pay | Admitting: Sports Medicine

## 2015-12-14 ENCOUNTER — Ambulatory Visit (INDEPENDENT_AMBULATORY_CARE_PROVIDER_SITE_OTHER): Payer: Medicare Other | Admitting: Sports Medicine

## 2015-12-14 ENCOUNTER — Ambulatory Visit: Payer: Medicare Other | Admitting: Family Medicine

## 2015-12-14 VITALS — BP 128/87 | HR 79 | Ht 69.0 in | Wt 275.0 lb

## 2015-12-14 DIAGNOSIS — M25511 Pain in right shoulder: Secondary | ICD-10-CM

## 2015-12-14 DIAGNOSIS — M19011 Primary osteoarthritis, right shoulder: Secondary | ICD-10-CM | POA: Diagnosis not present

## 2015-12-14 DIAGNOSIS — M19012 Primary osteoarthritis, left shoulder: Secondary | ICD-10-CM | POA: Diagnosis not present

## 2015-12-14 NOTE — Progress Notes (Signed)
   HPI  CC: Right shoulder pain Patient presents today for follow-up on his right shoulder pain. He was seen on 11/23/15 for the same issue. At that time he was provided a prescription for diclofenac, exercises for scapular stabilization, x-ray of the right shoulder, and was asked to follow-up in 3 weeks. Patient states that his pain has been significantly better controlled with the diclofenac. He endorses some occasional pain but states that at present his pain is very much tolerable.  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 He has a passion for bench pressing and states that he would like to continue bench pressing until he is at least 8975-55 years old. He is okay with taking occasional breaks from weight lifting but does not want to discontinue this practice entirely. He denies any weakness, numbness, or paresthesias.  Medications/Interventions Tried: Subacromial steroid injection by PCP  See HPI for ROS. CC and VS noted.  Objective: BP 128/87   Pulse 79   Ht 5\' 9"  (1.753 m)   Wt 275 lb (124.7 kg)   BMI 40.61 kg/m  Gen: Right-Hand Dominant. NAD, alert, cooperative. CV: Well-perfused. Resp: Non-labored. Neuro: Sensation intact throughout. Shoulder, Right: Inspection reveals no abnormalities, atrophy or asymmetry. Thoracic kyphosis present. Palpation is normal with no tenderness over AC joint or bicipital groove. ROM is limited in internal and external rotation. Rotator cuff strength normal throughout. No signs of impingement with negative Neer and Hawkin's tests, empty can. Speeds and Yergason's tests normal. No labral pathology noted with negative Obrien's. No apprehension sign   Assessment and plan:  Right shoulder pain Improved: Patient is here for follow-up on right shoulder pain. He was provided exercises and diclofenac for discomfort. X-rays obtained showed severe osteoarthritis of the glenohumeral joint, and a potential loose body. Patient denied any symptoms suggesting  loose body impingement/entrapment. - Discussed x-ray results. Patient would need total shoulder arthroplasty in order to fully treat the patient's symptoms. However, due to patient's activity, hobbies, and desires I would not currently recommend total arthroplasty at this time as patient would no longer be allowed to weight left with the affected shoulder. - Continue diclofenac; patient endorsed adequate pain/discomfort control at this time and thus there is no need to escalate treatment. - Can continue to receive subacromial injections as long as he understands the risk of connective tissue weakening. - Follow-up as needed.   Philip DeltonIan D McKeag, MD,MS,  PGY3 12/14/2015 6:38 PM    Pt seen and evaluated with the resident. I agree with the above plan of care. Patient has shoulder DJD but desires to continue weight lifting. He understands that any sort of shoulder replacement will mean an end to his weight lifting days. He seems to do well with intermittent doses of anti-inflammatories. Follow-up as needed.  Tonye Pearsonimothy R Mcintyre D.O.

## 2015-12-14 NOTE — Assessment & Plan Note (Addendum)
Improved: Patient is here for follow-up on right shoulder pain. He was provided exercises and diclofenac for discomfort. X-rays obtained showed severe osteoarthritis of the glenohumeral joint, and a potential loose body. Patient denied any symptoms suggesting loose body impingement/entrapment. - Discussed x-ray results. Patient would need total shoulder arthroplasty in order to fully treat the patient's symptoms. However, due to patient's activity, hobbies, and desires I would not currently recommend total arthroplasty at this time as patient would no longer be allowed to weight left with the affected shoulder. - Continue diclofenac; patient endorsed adequate pain/discomfort control at this time and thus there is no need to escalate treatment. - Can continue to receive subacromial injections as long as he understands the risk of connective tissue weakening. - Follow-up as needed.

## 2015-12-15 ENCOUNTER — Ambulatory Visit: Payer: Medicare Other | Admitting: Family Medicine

## 2015-12-17 DIAGNOSIS — I10 Essential (primary) hypertension: Secondary | ICD-10-CM | POA: Diagnosis not present

## 2015-12-17 DIAGNOSIS — K5909 Other constipation: Secondary | ICD-10-CM | POA: Diagnosis not present

## 2015-12-17 DIAGNOSIS — E781 Pure hyperglyceridemia: Secondary | ICD-10-CM | POA: Diagnosis not present

## 2015-12-17 DIAGNOSIS — E039 Hypothyroidism, unspecified: Secondary | ICD-10-CM | POA: Diagnosis not present

## 2015-12-17 DIAGNOSIS — E119 Type 2 diabetes mellitus without complications: Secondary | ICD-10-CM | POA: Diagnosis not present

## 2015-12-17 DIAGNOSIS — N289 Disorder of kidney and ureter, unspecified: Secondary | ICD-10-CM | POA: Diagnosis not present

## 2015-12-17 DIAGNOSIS — Z6841 Body Mass Index (BMI) 40.0 and over, adult: Secondary | ICD-10-CM | POA: Diagnosis not present

## 2016-01-05 ENCOUNTER — Other Ambulatory Visit: Payer: Self-pay | Admitting: *Deleted

## 2016-01-05 DIAGNOSIS — M25511 Pain in right shoulder: Secondary | ICD-10-CM

## 2016-01-05 MED ORDER — DICLOFENAC SODIUM 75 MG PO TBEC: 75.0000 mg | DELAYED_RELEASE_TABLET | Freq: Two times a day (BID) | ORAL | 3 refills | Status: DC

## 2016-01-12 DIAGNOSIS — F209 Schizophrenia, unspecified: Secondary | ICD-10-CM | POA: Diagnosis not present

## 2016-01-20 DIAGNOSIS — N289 Disorder of kidney and ureter, unspecified: Secondary | ICD-10-CM | POA: Diagnosis not present

## 2016-01-20 DIAGNOSIS — Z23 Encounter for immunization: Secondary | ICD-10-CM | POA: Diagnosis not present

## 2016-01-20 DIAGNOSIS — K5909 Other constipation: Secondary | ICD-10-CM | POA: Diagnosis not present

## 2016-01-20 DIAGNOSIS — E039 Hypothyroidism, unspecified: Secondary | ICD-10-CM | POA: Diagnosis not present

## 2016-01-20 DIAGNOSIS — E119 Type 2 diabetes mellitus without complications: Secondary | ICD-10-CM | POA: Diagnosis not present

## 2016-01-20 DIAGNOSIS — Z6841 Body Mass Index (BMI) 40.0 and over, adult: Secondary | ICD-10-CM | POA: Diagnosis not present

## 2016-01-20 DIAGNOSIS — E781 Pure hyperglyceridemia: Secondary | ICD-10-CM | POA: Diagnosis not present

## 2016-01-20 DIAGNOSIS — I10 Essential (primary) hypertension: Secondary | ICD-10-CM | POA: Diagnosis not present

## 2016-02-24 DIAGNOSIS — F209 Schizophrenia, unspecified: Secondary | ICD-10-CM | POA: Diagnosis not present

## 2016-03-21 DIAGNOSIS — N289 Disorder of kidney and ureter, unspecified: Secondary | ICD-10-CM | POA: Diagnosis not present

## 2016-03-21 DIAGNOSIS — I10 Essential (primary) hypertension: Secondary | ICD-10-CM | POA: Diagnosis not present

## 2016-03-21 DIAGNOSIS — Z6841 Body Mass Index (BMI) 40.0 and over, adult: Secondary | ICD-10-CM | POA: Diagnosis not present

## 2016-03-21 DIAGNOSIS — E119 Type 2 diabetes mellitus without complications: Secondary | ICD-10-CM | POA: Diagnosis not present

## 2016-03-21 DIAGNOSIS — K5909 Other constipation: Secondary | ICD-10-CM | POA: Diagnosis not present

## 2016-03-21 DIAGNOSIS — E039 Hypothyroidism, unspecified: Secondary | ICD-10-CM | POA: Diagnosis not present

## 2016-03-21 DIAGNOSIS — E781 Pure hyperglyceridemia: Secondary | ICD-10-CM | POA: Diagnosis not present

## 2016-04-20 DIAGNOSIS — F209 Schizophrenia, unspecified: Secondary | ICD-10-CM | POA: Diagnosis not present

## 2016-05-04 ENCOUNTER — Other Ambulatory Visit: Payer: Self-pay | Admitting: *Deleted

## 2016-05-04 DIAGNOSIS — M25511 Pain in right shoulder: Principal | ICD-10-CM

## 2016-05-04 DIAGNOSIS — G8929 Other chronic pain: Secondary | ICD-10-CM

## 2016-05-04 MED ORDER — DICLOFENAC SODIUM 75 MG PO TBEC: 75.0000 mg | DELAYED_RELEASE_TABLET | Freq: Two times a day (BID) | ORAL | 3 refills | Status: DC

## 2016-05-29 DIAGNOSIS — Z6839 Body mass index (BMI) 39.0-39.9, adult: Secondary | ICD-10-CM | POA: Diagnosis not present

## 2016-05-29 DIAGNOSIS — N289 Disorder of kidney and ureter, unspecified: Secondary | ICD-10-CM | POA: Diagnosis not present

## 2016-05-29 DIAGNOSIS — E039 Hypothyroidism, unspecified: Secondary | ICD-10-CM | POA: Diagnosis not present

## 2016-05-29 DIAGNOSIS — E781 Pure hyperglyceridemia: Secondary | ICD-10-CM | POA: Diagnosis not present

## 2016-05-29 DIAGNOSIS — E119 Type 2 diabetes mellitus without complications: Secondary | ICD-10-CM | POA: Diagnosis not present

## 2016-05-29 DIAGNOSIS — I10 Essential (primary) hypertension: Secondary | ICD-10-CM | POA: Diagnosis not present

## 2016-05-29 DIAGNOSIS — K5909 Other constipation: Secondary | ICD-10-CM | POA: Diagnosis not present

## 2016-06-13 DIAGNOSIS — F209 Schizophrenia, unspecified: Secondary | ICD-10-CM | POA: Diagnosis not present

## 2016-07-03 DIAGNOSIS — K5909 Other constipation: Secondary | ICD-10-CM | POA: Diagnosis not present

## 2016-07-03 DIAGNOSIS — N289 Disorder of kidney and ureter, unspecified: Secondary | ICD-10-CM | POA: Diagnosis not present

## 2016-07-03 DIAGNOSIS — E781 Pure hyperglyceridemia: Secondary | ICD-10-CM | POA: Diagnosis not present

## 2016-07-03 DIAGNOSIS — E119 Type 2 diabetes mellitus without complications: Secondary | ICD-10-CM | POA: Diagnosis not present

## 2016-07-03 DIAGNOSIS — E039 Hypothyroidism, unspecified: Secondary | ICD-10-CM | POA: Diagnosis not present

## 2016-07-03 DIAGNOSIS — Z6838 Body mass index (BMI) 38.0-38.9, adult: Secondary | ICD-10-CM | POA: Diagnosis not present

## 2016-07-03 DIAGNOSIS — E669 Obesity, unspecified: Secondary | ICD-10-CM | POA: Diagnosis not present

## 2016-07-03 DIAGNOSIS — I10 Essential (primary) hypertension: Secondary | ICD-10-CM | POA: Diagnosis not present

## 2016-07-10 DIAGNOSIS — E039 Hypothyroidism, unspecified: Secondary | ICD-10-CM | POA: Diagnosis not present

## 2016-07-10 DIAGNOSIS — E669 Obesity, unspecified: Secondary | ICD-10-CM | POA: Diagnosis not present

## 2016-07-10 DIAGNOSIS — Z6838 Body mass index (BMI) 38.0-38.9, adult: Secondary | ICD-10-CM | POA: Diagnosis not present

## 2016-07-10 DIAGNOSIS — I10 Essential (primary) hypertension: Secondary | ICD-10-CM | POA: Diagnosis not present

## 2016-07-10 DIAGNOSIS — E781 Pure hyperglyceridemia: Secondary | ICD-10-CM | POA: Diagnosis not present

## 2016-07-10 DIAGNOSIS — E119 Type 2 diabetes mellitus without complications: Secondary | ICD-10-CM | POA: Diagnosis not present

## 2016-07-10 DIAGNOSIS — N289 Disorder of kidney and ureter, unspecified: Secondary | ICD-10-CM | POA: Diagnosis not present

## 2016-07-10 DIAGNOSIS — K5909 Other constipation: Secondary | ICD-10-CM | POA: Diagnosis not present

## 2016-07-11 DIAGNOSIS — E119 Type 2 diabetes mellitus without complications: Secondary | ICD-10-CM | POA: Diagnosis not present

## 2016-07-17 DIAGNOSIS — E039 Hypothyroidism, unspecified: Secondary | ICD-10-CM | POA: Diagnosis not present

## 2016-07-17 DIAGNOSIS — E119 Type 2 diabetes mellitus without complications: Secondary | ICD-10-CM | POA: Diagnosis not present

## 2016-07-17 DIAGNOSIS — N289 Disorder of kidney and ureter, unspecified: Secondary | ICD-10-CM | POA: Diagnosis not present

## 2016-07-17 DIAGNOSIS — E781 Pure hyperglyceridemia: Secondary | ICD-10-CM | POA: Diagnosis not present

## 2016-07-17 DIAGNOSIS — I10 Essential (primary) hypertension: Secondary | ICD-10-CM | POA: Diagnosis not present

## 2016-07-17 DIAGNOSIS — E559 Vitamin D deficiency, unspecified: Secondary | ICD-10-CM | POA: Diagnosis not present

## 2016-07-17 DIAGNOSIS — Z6838 Body mass index (BMI) 38.0-38.9, adult: Secondary | ICD-10-CM | POA: Diagnosis not present

## 2016-07-17 DIAGNOSIS — K5909 Other constipation: Secondary | ICD-10-CM | POA: Diagnosis not present

## 2016-07-24 DIAGNOSIS — E119 Type 2 diabetes mellitus without complications: Secondary | ICD-10-CM | POA: Diagnosis not present

## 2016-07-24 DIAGNOSIS — E669 Obesity, unspecified: Secondary | ICD-10-CM | POA: Diagnosis not present

## 2016-07-24 DIAGNOSIS — E039 Hypothyroidism, unspecified: Secondary | ICD-10-CM | POA: Diagnosis not present

## 2016-07-24 DIAGNOSIS — K5909 Other constipation: Secondary | ICD-10-CM | POA: Diagnosis not present

## 2016-07-24 DIAGNOSIS — E781 Pure hyperglyceridemia: Secondary | ICD-10-CM | POA: Diagnosis not present

## 2016-07-24 DIAGNOSIS — I10 Essential (primary) hypertension: Secondary | ICD-10-CM | POA: Diagnosis not present

## 2016-07-24 DIAGNOSIS — N289 Disorder of kidney and ureter, unspecified: Secondary | ICD-10-CM | POA: Diagnosis not present

## 2016-07-24 DIAGNOSIS — Z6838 Body mass index (BMI) 38.0-38.9, adult: Secondary | ICD-10-CM | POA: Diagnosis not present

## 2016-08-01 DIAGNOSIS — F209 Schizophrenia, unspecified: Secondary | ICD-10-CM | POA: Diagnosis not present

## 2016-08-07 DIAGNOSIS — N289 Disorder of kidney and ureter, unspecified: Secondary | ICD-10-CM | POA: Diagnosis not present

## 2016-08-07 DIAGNOSIS — E039 Hypothyroidism, unspecified: Secondary | ICD-10-CM | POA: Diagnosis not present

## 2016-08-07 DIAGNOSIS — Z1211 Encounter for screening for malignant neoplasm of colon: Secondary | ICD-10-CM | POA: Diagnosis not present

## 2016-08-07 DIAGNOSIS — E669 Obesity, unspecified: Secondary | ICD-10-CM | POA: Diagnosis not present

## 2016-08-07 DIAGNOSIS — I10 Essential (primary) hypertension: Secondary | ICD-10-CM | POA: Diagnosis not present

## 2016-08-07 DIAGNOSIS — Z6838 Body mass index (BMI) 38.0-38.9, adult: Secondary | ICD-10-CM | POA: Diagnosis not present

## 2016-08-07 DIAGNOSIS — E781 Pure hyperglyceridemia: Secondary | ICD-10-CM | POA: Diagnosis not present

## 2016-08-07 DIAGNOSIS — E119 Type 2 diabetes mellitus without complications: Secondary | ICD-10-CM | POA: Diagnosis not present

## 2016-08-07 DIAGNOSIS — K5909 Other constipation: Secondary | ICD-10-CM | POA: Diagnosis not present

## 2016-09-04 ENCOUNTER — Other Ambulatory Visit: Payer: Self-pay | Admitting: *Deleted

## 2016-09-04 DIAGNOSIS — G8929 Other chronic pain: Secondary | ICD-10-CM

## 2016-09-04 DIAGNOSIS — M25511 Pain in right shoulder: Principal | ICD-10-CM

## 2016-09-04 MED ORDER — DICLOFENAC SODIUM 75 MG PO TBEC: 75.0000 mg | DELAYED_RELEASE_TABLET | Freq: Two times a day (BID) | ORAL | 1 refills | Status: AC

## 2016-09-15 DIAGNOSIS — E039 Hypothyroidism, unspecified: Secondary | ICD-10-CM | POA: Diagnosis not present

## 2016-09-15 DIAGNOSIS — Z6838 Body mass index (BMI) 38.0-38.9, adult: Secondary | ICD-10-CM | POA: Diagnosis not present

## 2016-09-15 DIAGNOSIS — K5909 Other constipation: Secondary | ICD-10-CM | POA: Diagnosis not present

## 2016-09-15 DIAGNOSIS — N289 Disorder of kidney and ureter, unspecified: Secondary | ICD-10-CM | POA: Diagnosis not present

## 2016-09-15 DIAGNOSIS — E781 Pure hyperglyceridemia: Secondary | ICD-10-CM | POA: Diagnosis not present

## 2016-09-15 DIAGNOSIS — E669 Obesity, unspecified: Secondary | ICD-10-CM | POA: Diagnosis not present

## 2016-09-15 DIAGNOSIS — E119 Type 2 diabetes mellitus without complications: Secondary | ICD-10-CM | POA: Diagnosis not present

## 2016-09-15 DIAGNOSIS — I10 Essential (primary) hypertension: Secondary | ICD-10-CM | POA: Diagnosis not present

## 2016-09-24 DIAGNOSIS — E119 Type 2 diabetes mellitus without complications: Secondary | ICD-10-CM | POA: Diagnosis present

## 2016-09-24 DIAGNOSIS — R069 Unspecified abnormalities of breathing: Secondary | ICD-10-CM | POA: Diagnosis not present

## 2016-09-24 DIAGNOSIS — F609 Personality disorder, unspecified: Secondary | ICD-10-CM | POA: Diagnosis not present

## 2016-09-24 DIAGNOSIS — F79 Unspecified intellectual disabilities: Secondary | ICD-10-CM | POA: Diagnosis not present

## 2016-09-24 DIAGNOSIS — E669 Obesity, unspecified: Secondary | ICD-10-CM | POA: Diagnosis present

## 2016-09-24 DIAGNOSIS — I2609 Other pulmonary embolism with acute cor pulmonale: Secondary | ICD-10-CM | POA: Diagnosis not present

## 2016-09-24 DIAGNOSIS — I2699 Other pulmonary embolism without acute cor pulmonale: Secondary | ICD-10-CM | POA: Diagnosis not present

## 2016-09-24 DIAGNOSIS — N289 Disorder of kidney and ureter, unspecified: Secondary | ICD-10-CM | POA: Diagnosis not present

## 2016-09-24 DIAGNOSIS — Z79899 Other long term (current) drug therapy: Secondary | ICD-10-CM | POA: Diagnosis not present

## 2016-09-24 DIAGNOSIS — Z23 Encounter for immunization: Secondary | ICD-10-CM | POA: Diagnosis not present

## 2016-09-24 DIAGNOSIS — E039 Hypothyroidism, unspecified: Secondary | ICD-10-CM | POA: Diagnosis not present

## 2016-09-24 DIAGNOSIS — R42 Dizziness and giddiness: Secondary | ICD-10-CM | POA: Diagnosis not present

## 2016-09-24 DIAGNOSIS — J9601 Acute respiratory failure with hypoxia: Secondary | ICD-10-CM | POA: Diagnosis not present

## 2016-09-24 DIAGNOSIS — F70 Mild intellectual disabilities: Secondary | ICD-10-CM | POA: Diagnosis present

## 2016-09-24 DIAGNOSIS — E785 Hyperlipidemia, unspecified: Secondary | ICD-10-CM | POA: Diagnosis not present

## 2016-09-24 DIAGNOSIS — R0602 Shortness of breath: Secondary | ICD-10-CM | POA: Diagnosis not present

## 2016-09-24 DIAGNOSIS — I509 Heart failure, unspecified: Secondary | ICD-10-CM | POA: Diagnosis not present

## 2016-09-24 DIAGNOSIS — E1169 Type 2 diabetes mellitus with other specified complication: Secondary | ICD-10-CM | POA: Diagnosis not present

## 2016-10-02 DIAGNOSIS — I10 Essential (primary) hypertension: Secondary | ICD-10-CM | POA: Diagnosis not present

## 2016-10-02 DIAGNOSIS — K5909 Other constipation: Secondary | ICD-10-CM | POA: Diagnosis not present

## 2016-10-02 DIAGNOSIS — E039 Hypothyroidism, unspecified: Secondary | ICD-10-CM | POA: Diagnosis not present

## 2016-10-02 DIAGNOSIS — N289 Disorder of kidney and ureter, unspecified: Secondary | ICD-10-CM | POA: Diagnosis not present

## 2016-10-02 DIAGNOSIS — E781 Pure hyperglyceridemia: Secondary | ICD-10-CM | POA: Diagnosis not present

## 2016-10-02 DIAGNOSIS — Z6838 Body mass index (BMI) 38.0-38.9, adult: Secondary | ICD-10-CM | POA: Diagnosis not present

## 2016-10-02 DIAGNOSIS — E119 Type 2 diabetes mellitus without complications: Secondary | ICD-10-CM | POA: Diagnosis not present

## 2016-10-02 DIAGNOSIS — I2699 Other pulmonary embolism without acute cor pulmonale: Secondary | ICD-10-CM | POA: Diagnosis not present

## 2016-10-03 DIAGNOSIS — F209 Schizophrenia, unspecified: Secondary | ICD-10-CM | POA: Diagnosis not present

## 2016-10-06 DIAGNOSIS — F1910 Other psychoactive substance abuse, uncomplicated: Secondary | ICD-10-CM

## 2016-10-06 DIAGNOSIS — E785 Hyperlipidemia, unspecified: Secondary | ICD-10-CM

## 2016-10-06 DIAGNOSIS — F609 Personality disorder, unspecified: Secondary | ICD-10-CM

## 2016-10-06 DIAGNOSIS — F79 Unspecified intellectual disabilities: Secondary | ICD-10-CM

## 2016-10-06 DIAGNOSIS — R0602 Shortness of breath: Secondary | ICD-10-CM | POA: Diagnosis not present

## 2016-10-06 DIAGNOSIS — R400 Somnolence: Secondary | ICD-10-CM | POA: Diagnosis not present

## 2016-10-06 DIAGNOSIS — E039 Hypothyroidism, unspecified: Secondary | ICD-10-CM | POA: Diagnosis not present

## 2016-10-06 DIAGNOSIS — E119 Type 2 diabetes mellitus without complications: Secondary | ICD-10-CM | POA: Diagnosis not present

## 2016-10-06 DIAGNOSIS — R4182 Altered mental status, unspecified: Secondary | ICD-10-CM | POA: Diagnosis not present

## 2016-10-06 DIAGNOSIS — J9601 Acute respiratory failure with hypoxia: Secondary | ICD-10-CM | POA: Diagnosis not present

## 2016-10-07 DIAGNOSIS — E785 Hyperlipidemia, unspecified: Secondary | ICD-10-CM | POA: Diagnosis not present

## 2016-10-07 DIAGNOSIS — F1910 Other psychoactive substance abuse, uncomplicated: Secondary | ICD-10-CM | POA: Diagnosis not present

## 2016-10-07 DIAGNOSIS — R9431 Abnormal electrocardiogram [ECG] [EKG]: Secondary | ICD-10-CM | POA: Diagnosis not present

## 2016-10-07 DIAGNOSIS — J9601 Acute respiratory failure with hypoxia: Secondary | ICD-10-CM | POA: Diagnosis not present

## 2016-10-07 DIAGNOSIS — E039 Hypothyroidism, unspecified: Secondary | ICD-10-CM | POA: Diagnosis not present

## 2016-10-07 DIAGNOSIS — E119 Type 2 diabetes mellitus without complications: Secondary | ICD-10-CM | POA: Diagnosis not present

## 2016-10-08 DIAGNOSIS — F1910 Other psychoactive substance abuse, uncomplicated: Secondary | ICD-10-CM | POA: Diagnosis not present

## 2016-10-08 DIAGNOSIS — I4581 Long QT syndrome: Secondary | ICD-10-CM | POA: Diagnosis not present

## 2016-10-08 DIAGNOSIS — J9601 Acute respiratory failure with hypoxia: Secondary | ICD-10-CM | POA: Diagnosis not present

## 2016-10-08 DIAGNOSIS — E785 Hyperlipidemia, unspecified: Secondary | ICD-10-CM | POA: Diagnosis not present

## 2016-10-08 DIAGNOSIS — E119 Type 2 diabetes mellitus without complications: Secondary | ICD-10-CM | POA: Diagnosis not present

## 2016-10-08 DIAGNOSIS — E039 Hypothyroidism, unspecified: Secondary | ICD-10-CM | POA: Diagnosis not present

## 2016-10-09 DIAGNOSIS — E119 Type 2 diabetes mellitus without complications: Secondary | ICD-10-CM | POA: Diagnosis not present

## 2016-10-09 DIAGNOSIS — I2699 Other pulmonary embolism without acute cor pulmonale: Secondary | ICD-10-CM | POA: Diagnosis not present

## 2016-10-09 DIAGNOSIS — Z6837 Body mass index (BMI) 37.0-37.9, adult: Secondary | ICD-10-CM | POA: Diagnosis not present

## 2016-10-09 DIAGNOSIS — E039 Hypothyroidism, unspecified: Secondary | ICD-10-CM | POA: Diagnosis not present

## 2016-10-09 DIAGNOSIS — N289 Disorder of kidney and ureter, unspecified: Secondary | ICD-10-CM | POA: Diagnosis not present

## 2016-10-09 DIAGNOSIS — K5909 Other constipation: Secondary | ICD-10-CM | POA: Diagnosis not present

## 2016-10-09 DIAGNOSIS — E781 Pure hyperglyceridemia: Secondary | ICD-10-CM | POA: Diagnosis not present

## 2016-10-09 DIAGNOSIS — I10 Essential (primary) hypertension: Secondary | ICD-10-CM | POA: Diagnosis not present

## 2016-10-16 DIAGNOSIS — N289 Disorder of kidney and ureter, unspecified: Secondary | ICD-10-CM | POA: Diagnosis not present

## 2016-10-16 DIAGNOSIS — E781 Pure hyperglyceridemia: Secondary | ICD-10-CM | POA: Diagnosis not present

## 2016-10-16 DIAGNOSIS — E039 Hypothyroidism, unspecified: Secondary | ICD-10-CM | POA: Diagnosis not present

## 2016-10-16 DIAGNOSIS — J9601 Acute respiratory failure with hypoxia: Secondary | ICD-10-CM | POA: Diagnosis not present

## 2016-10-16 DIAGNOSIS — Z6836 Body mass index (BMI) 36.0-36.9, adult: Secondary | ICD-10-CM | POA: Diagnosis not present

## 2016-10-16 DIAGNOSIS — I2699 Other pulmonary embolism without acute cor pulmonale: Secondary | ICD-10-CM | POA: Diagnosis not present

## 2016-10-16 DIAGNOSIS — I10 Essential (primary) hypertension: Secondary | ICD-10-CM | POA: Diagnosis not present

## 2016-10-16 DIAGNOSIS — K5909 Other constipation: Secondary | ICD-10-CM | POA: Diagnosis not present

## 2016-10-16 DIAGNOSIS — E119 Type 2 diabetes mellitus without complications: Secondary | ICD-10-CM | POA: Diagnosis not present

## 2016-10-23 DIAGNOSIS — R945 Abnormal results of liver function studies: Secondary | ICD-10-CM | POA: Diagnosis not present

## 2016-10-23 DIAGNOSIS — I10 Essential (primary) hypertension: Secondary | ICD-10-CM | POA: Diagnosis not present

## 2016-10-23 DIAGNOSIS — E781 Pure hyperglyceridemia: Secondary | ICD-10-CM | POA: Diagnosis not present

## 2016-10-23 DIAGNOSIS — E039 Hypothyroidism, unspecified: Secondary | ICD-10-CM | POA: Diagnosis not present

## 2016-10-23 DIAGNOSIS — K5909 Other constipation: Secondary | ICD-10-CM | POA: Diagnosis not present

## 2016-10-23 DIAGNOSIS — E119 Type 2 diabetes mellitus without complications: Secondary | ICD-10-CM | POA: Diagnosis not present

## 2016-10-23 DIAGNOSIS — Z6836 Body mass index (BMI) 36.0-36.9, adult: Secondary | ICD-10-CM | POA: Diagnosis not present

## 2016-10-23 DIAGNOSIS — I2699 Other pulmonary embolism without acute cor pulmonale: Secondary | ICD-10-CM | POA: Diagnosis not present

## 2016-10-23 DIAGNOSIS — N289 Disorder of kidney and ureter, unspecified: Secondary | ICD-10-CM | POA: Diagnosis not present

## 2016-10-30 DIAGNOSIS — F209 Schizophrenia, unspecified: Secondary | ICD-10-CM | POA: Diagnosis not present

## 2016-11-06 DIAGNOSIS — E781 Pure hyperglyceridemia: Secondary | ICD-10-CM | POA: Diagnosis not present

## 2016-11-06 DIAGNOSIS — K5909 Other constipation: Secondary | ICD-10-CM | POA: Diagnosis not present

## 2016-11-06 DIAGNOSIS — R945 Abnormal results of liver function studies: Secondary | ICD-10-CM | POA: Diagnosis not present

## 2016-11-06 DIAGNOSIS — N289 Disorder of kidney and ureter, unspecified: Secondary | ICD-10-CM | POA: Diagnosis not present

## 2016-11-06 DIAGNOSIS — I2699 Other pulmonary embolism without acute cor pulmonale: Secondary | ICD-10-CM | POA: Diagnosis not present

## 2016-11-06 DIAGNOSIS — I10 Essential (primary) hypertension: Secondary | ICD-10-CM | POA: Diagnosis not present

## 2016-11-06 DIAGNOSIS — Z1211 Encounter for screening for malignant neoplasm of colon: Secondary | ICD-10-CM | POA: Diagnosis not present

## 2016-11-06 DIAGNOSIS — E039 Hypothyroidism, unspecified: Secondary | ICD-10-CM | POA: Diagnosis not present

## 2016-11-06 DIAGNOSIS — Z6837 Body mass index (BMI) 37.0-37.9, adult: Secondary | ICD-10-CM | POA: Diagnosis not present

## 2016-11-06 DIAGNOSIS — E119 Type 2 diabetes mellitus without complications: Secondary | ICD-10-CM | POA: Diagnosis not present

## 2016-11-27 DIAGNOSIS — F209 Schizophrenia, unspecified: Secondary | ICD-10-CM | POA: Diagnosis not present

## 2016-12-07 DIAGNOSIS — E039 Hypothyroidism, unspecified: Secondary | ICD-10-CM | POA: Diagnosis not present

## 2016-12-07 DIAGNOSIS — N289 Disorder of kidney and ureter, unspecified: Secondary | ICD-10-CM | POA: Diagnosis not present

## 2016-12-07 DIAGNOSIS — E119 Type 2 diabetes mellitus without complications: Secondary | ICD-10-CM | POA: Diagnosis not present

## 2016-12-07 DIAGNOSIS — K5909 Other constipation: Secondary | ICD-10-CM | POA: Diagnosis not present

## 2016-12-07 DIAGNOSIS — E781 Pure hyperglyceridemia: Secondary | ICD-10-CM | POA: Diagnosis not present

## 2016-12-07 DIAGNOSIS — I2699 Other pulmonary embolism without acute cor pulmonale: Secondary | ICD-10-CM | POA: Diagnosis not present

## 2016-12-07 DIAGNOSIS — R945 Abnormal results of liver function studies: Secondary | ICD-10-CM | POA: Diagnosis not present

## 2016-12-07 DIAGNOSIS — I10 Essential (primary) hypertension: Secondary | ICD-10-CM | POA: Diagnosis not present

## 2016-12-07 DIAGNOSIS — Z6837 Body mass index (BMI) 37.0-37.9, adult: Secondary | ICD-10-CM | POA: Diagnosis not present

## 2017-01-04 DIAGNOSIS — N289 Disorder of kidney and ureter, unspecified: Secondary | ICD-10-CM | POA: Diagnosis not present

## 2017-01-04 DIAGNOSIS — Z9181 History of falling: Secondary | ICD-10-CM | POA: Diagnosis not present

## 2017-01-04 DIAGNOSIS — R945 Abnormal results of liver function studies: Secondary | ICD-10-CM | POA: Diagnosis not present

## 2017-01-04 DIAGNOSIS — Z6837 Body mass index (BMI) 37.0-37.9, adult: Secondary | ICD-10-CM | POA: Diagnosis not present

## 2017-01-04 DIAGNOSIS — E781 Pure hyperglyceridemia: Secondary | ICD-10-CM | POA: Diagnosis not present

## 2017-01-04 DIAGNOSIS — Z1389 Encounter for screening for other disorder: Secondary | ICD-10-CM | POA: Diagnosis not present

## 2017-01-04 DIAGNOSIS — K5909 Other constipation: Secondary | ICD-10-CM | POA: Diagnosis not present

## 2017-01-04 DIAGNOSIS — I2699 Other pulmonary embolism without acute cor pulmonale: Secondary | ICD-10-CM | POA: Diagnosis not present

## 2017-01-04 DIAGNOSIS — E039 Hypothyroidism, unspecified: Secondary | ICD-10-CM | POA: Diagnosis not present

## 2017-01-04 DIAGNOSIS — E119 Type 2 diabetes mellitus without complications: Secondary | ICD-10-CM | POA: Diagnosis not present

## 2017-01-04 DIAGNOSIS — I10 Essential (primary) hypertension: Secondary | ICD-10-CM | POA: Diagnosis not present

## 2017-01-24 DIAGNOSIS — F209 Schizophrenia, unspecified: Secondary | ICD-10-CM | POA: Diagnosis not present

## 2017-02-01 DIAGNOSIS — E781 Pure hyperglyceridemia: Secondary | ICD-10-CM | POA: Diagnosis not present

## 2017-02-01 DIAGNOSIS — I2699 Other pulmonary embolism without acute cor pulmonale: Secondary | ICD-10-CM | POA: Diagnosis not present

## 2017-02-01 DIAGNOSIS — Z23 Encounter for immunization: Secondary | ICD-10-CM | POA: Diagnosis not present

## 2017-02-01 DIAGNOSIS — I10 Essential (primary) hypertension: Secondary | ICD-10-CM | POA: Diagnosis not present

## 2017-02-01 DIAGNOSIS — K5909 Other constipation: Secondary | ICD-10-CM | POA: Diagnosis not present

## 2017-02-01 DIAGNOSIS — N289 Disorder of kidney and ureter, unspecified: Secondary | ICD-10-CM | POA: Diagnosis not present

## 2017-02-01 DIAGNOSIS — R945 Abnormal results of liver function studies: Secondary | ICD-10-CM | POA: Diagnosis not present

## 2017-02-01 DIAGNOSIS — E119 Type 2 diabetes mellitus without complications: Secondary | ICD-10-CM | POA: Diagnosis not present

## 2017-02-01 DIAGNOSIS — E039 Hypothyroidism, unspecified: Secondary | ICD-10-CM | POA: Diagnosis not present

## 2017-02-01 DIAGNOSIS — Z6839 Body mass index (BMI) 39.0-39.9, adult: Secondary | ICD-10-CM | POA: Diagnosis not present

## 2017-03-07 DIAGNOSIS — E119 Type 2 diabetes mellitus without complications: Secondary | ICD-10-CM | POA: Diagnosis not present

## 2017-03-07 DIAGNOSIS — I2699 Other pulmonary embolism without acute cor pulmonale: Secondary | ICD-10-CM | POA: Diagnosis not present

## 2017-03-07 DIAGNOSIS — E039 Hypothyroidism, unspecified: Secondary | ICD-10-CM | POA: Diagnosis not present

## 2017-03-07 DIAGNOSIS — K5909 Other constipation: Secondary | ICD-10-CM | POA: Diagnosis not present

## 2017-03-07 DIAGNOSIS — E669 Obesity, unspecified: Secondary | ICD-10-CM | POA: Diagnosis not present

## 2017-03-07 DIAGNOSIS — Z6839 Body mass index (BMI) 39.0-39.9, adult: Secondary | ICD-10-CM | POA: Diagnosis not present

## 2017-03-07 DIAGNOSIS — R945 Abnormal results of liver function studies: Secondary | ICD-10-CM | POA: Diagnosis not present

## 2017-03-07 DIAGNOSIS — N289 Disorder of kidney and ureter, unspecified: Secondary | ICD-10-CM | POA: Diagnosis not present

## 2017-03-07 DIAGNOSIS — E781 Pure hyperglyceridemia: Secondary | ICD-10-CM | POA: Diagnosis not present

## 2017-03-07 DIAGNOSIS — I10 Essential (primary) hypertension: Secondary | ICD-10-CM | POA: Diagnosis not present

## 2017-03-28 DIAGNOSIS — F209 Schizophrenia, unspecified: Secondary | ICD-10-CM | POA: Diagnosis not present

## 2017-04-04 DIAGNOSIS — E781 Pure hyperglyceridemia: Secondary | ICD-10-CM | POA: Diagnosis not present

## 2017-04-04 DIAGNOSIS — K5909 Other constipation: Secondary | ICD-10-CM | POA: Diagnosis not present

## 2017-04-04 DIAGNOSIS — R945 Abnormal results of liver function studies: Secondary | ICD-10-CM | POA: Diagnosis not present

## 2017-04-04 DIAGNOSIS — N289 Disorder of kidney and ureter, unspecified: Secondary | ICD-10-CM | POA: Diagnosis not present

## 2017-04-04 DIAGNOSIS — E039 Hypothyroidism, unspecified: Secondary | ICD-10-CM | POA: Diagnosis not present

## 2017-04-04 DIAGNOSIS — Z6839 Body mass index (BMI) 39.0-39.9, adult: Secondary | ICD-10-CM | POA: Diagnosis not present

## 2017-04-04 DIAGNOSIS — I2699 Other pulmonary embolism without acute cor pulmonale: Secondary | ICD-10-CM | POA: Diagnosis not present

## 2017-04-04 DIAGNOSIS — E119 Type 2 diabetes mellitus without complications: Secondary | ICD-10-CM | POA: Diagnosis not present

## 2017-04-04 DIAGNOSIS — I10 Essential (primary) hypertension: Secondary | ICD-10-CM | POA: Diagnosis not present

## 2017-05-04 DIAGNOSIS — E781 Pure hyperglyceridemia: Secondary | ICD-10-CM | POA: Diagnosis not present

## 2017-05-04 DIAGNOSIS — N289 Disorder of kidney and ureter, unspecified: Secondary | ICD-10-CM | POA: Diagnosis not present

## 2017-05-04 DIAGNOSIS — E039 Hypothyroidism, unspecified: Secondary | ICD-10-CM | POA: Diagnosis not present

## 2017-05-04 DIAGNOSIS — R945 Abnormal results of liver function studies: Secondary | ICD-10-CM | POA: Diagnosis not present

## 2017-05-04 DIAGNOSIS — I2699 Other pulmonary embolism without acute cor pulmonale: Secondary | ICD-10-CM | POA: Diagnosis not present

## 2017-05-04 DIAGNOSIS — Z6838 Body mass index (BMI) 38.0-38.9, adult: Secondary | ICD-10-CM | POA: Diagnosis not present

## 2017-05-04 DIAGNOSIS — K5909 Other constipation: Secondary | ICD-10-CM | POA: Diagnosis not present

## 2017-05-04 DIAGNOSIS — I10 Essential (primary) hypertension: Secondary | ICD-10-CM | POA: Diagnosis not present

## 2017-05-04 DIAGNOSIS — E119 Type 2 diabetes mellitus without complications: Secondary | ICD-10-CM | POA: Diagnosis not present

## 2017-05-23 DIAGNOSIS — F209 Schizophrenia, unspecified: Secondary | ICD-10-CM | POA: Diagnosis not present

## 2017-05-28 DIAGNOSIS — Z800 Family history of malignant neoplasm of digestive organs: Secondary | ICD-10-CM | POA: Diagnosis not present

## 2017-05-28 DIAGNOSIS — Z1211 Encounter for screening for malignant neoplasm of colon: Secondary | ICD-10-CM | POA: Diagnosis not present

## 2017-06-05 DIAGNOSIS — I2699 Other pulmonary embolism without acute cor pulmonale: Secondary | ICD-10-CM | POA: Diagnosis not present

## 2017-06-05 DIAGNOSIS — E669 Obesity, unspecified: Secondary | ICD-10-CM | POA: Diagnosis not present

## 2017-06-05 DIAGNOSIS — E119 Type 2 diabetes mellitus without complications: Secondary | ICD-10-CM | POA: Diagnosis not present

## 2017-06-05 DIAGNOSIS — E039 Hypothyroidism, unspecified: Secondary | ICD-10-CM | POA: Diagnosis not present

## 2017-06-05 DIAGNOSIS — I10 Essential (primary) hypertension: Secondary | ICD-10-CM | POA: Diagnosis not present

## 2017-06-05 DIAGNOSIS — K5909 Other constipation: Secondary | ICD-10-CM | POA: Diagnosis not present

## 2017-06-05 DIAGNOSIS — E781 Pure hyperglyceridemia: Secondary | ICD-10-CM | POA: Diagnosis not present

## 2017-06-05 DIAGNOSIS — Z6838 Body mass index (BMI) 38.0-38.9, adult: Secondary | ICD-10-CM | POA: Diagnosis not present

## 2017-06-05 DIAGNOSIS — N289 Disorder of kidney and ureter, unspecified: Secondary | ICD-10-CM | POA: Diagnosis not present

## 2017-06-05 DIAGNOSIS — R945 Abnormal results of liver function studies: Secondary | ICD-10-CM | POA: Diagnosis not present

## 2017-06-18 DIAGNOSIS — F209 Schizophrenia, unspecified: Secondary | ICD-10-CM | POA: Diagnosis not present

## 2017-06-18 IMAGING — CR DG SHOULDER 2+V*R*
3 series · 3 of 3 positions shown · non-contrast
Comparison: None.

CLINICAL DATA: Chronic right shoulder pain and weight lifter.
Initial encounter. No known injury.

EXAM:
RIGHT SHOULDER - 2+ VIEW

[w shoulder ap internal righ]
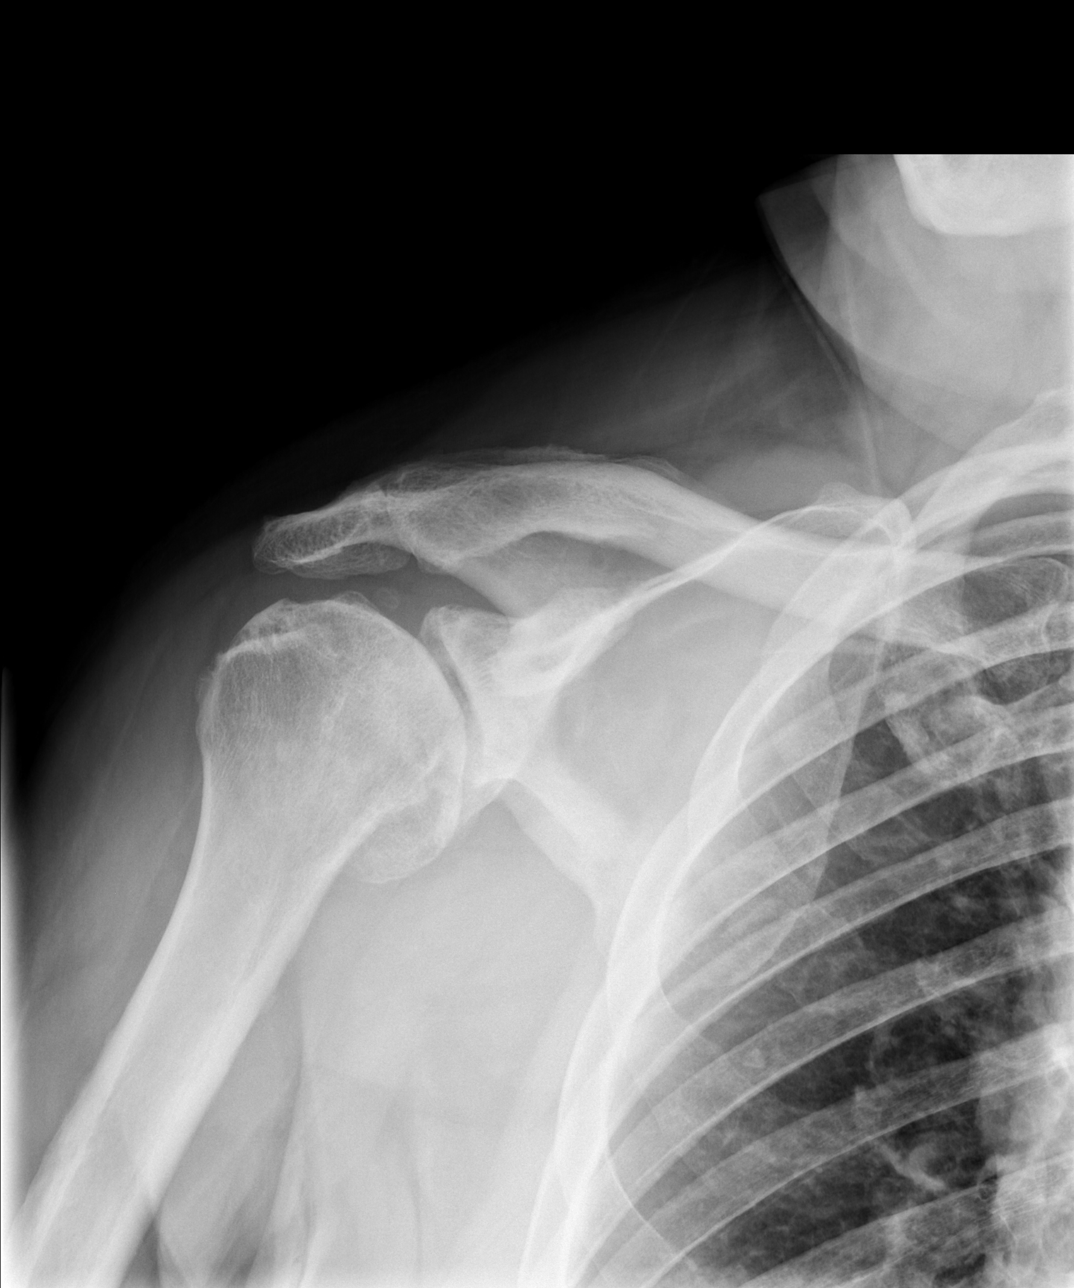

[w shoulder y view right]
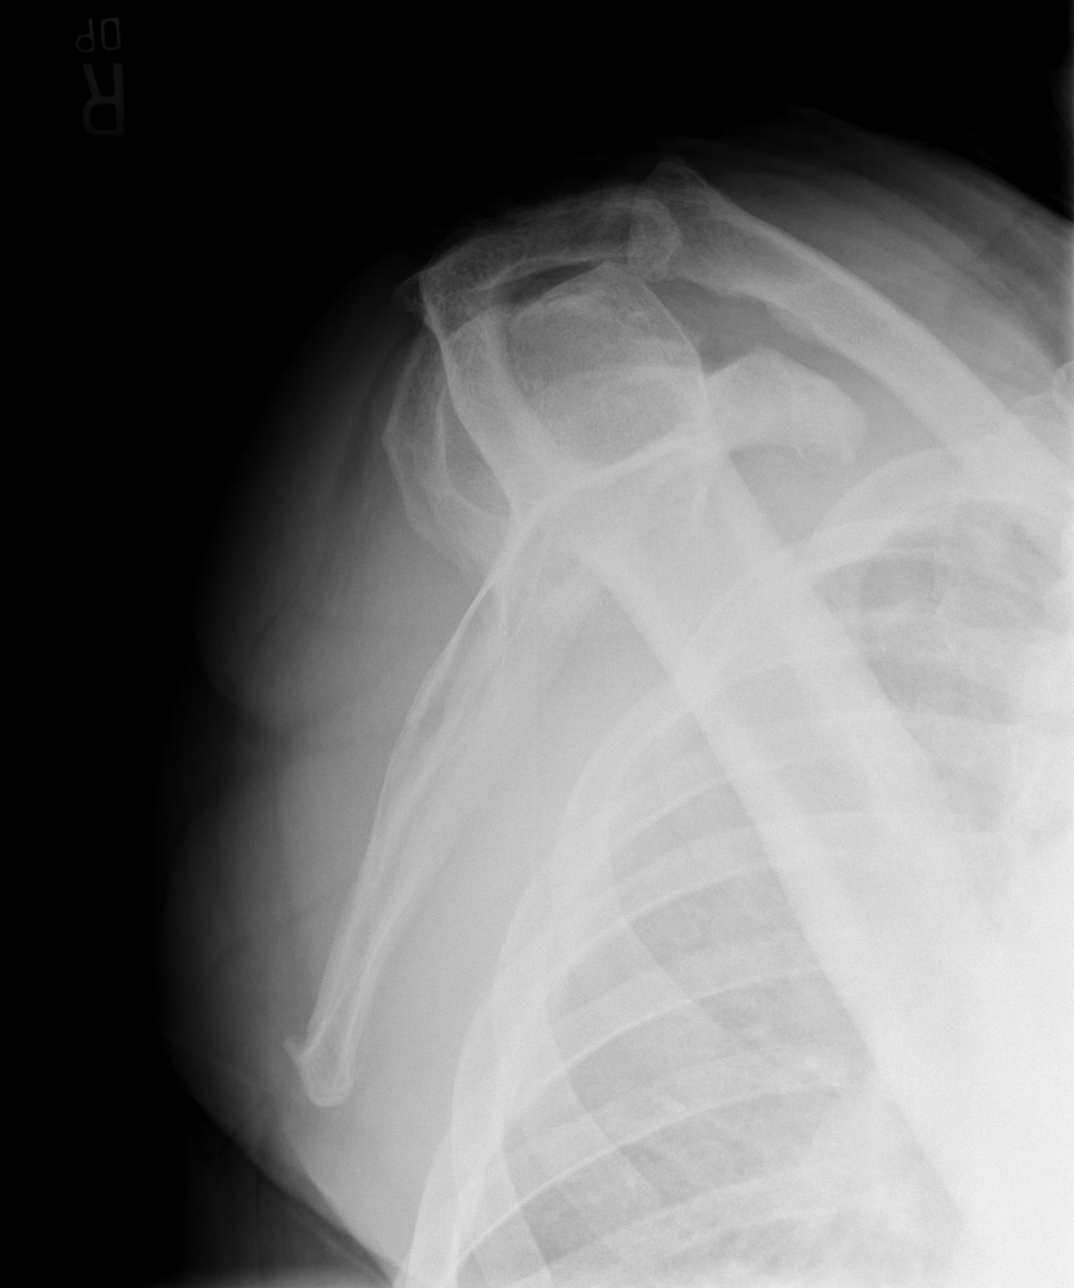

[w shoulder axillary right *]
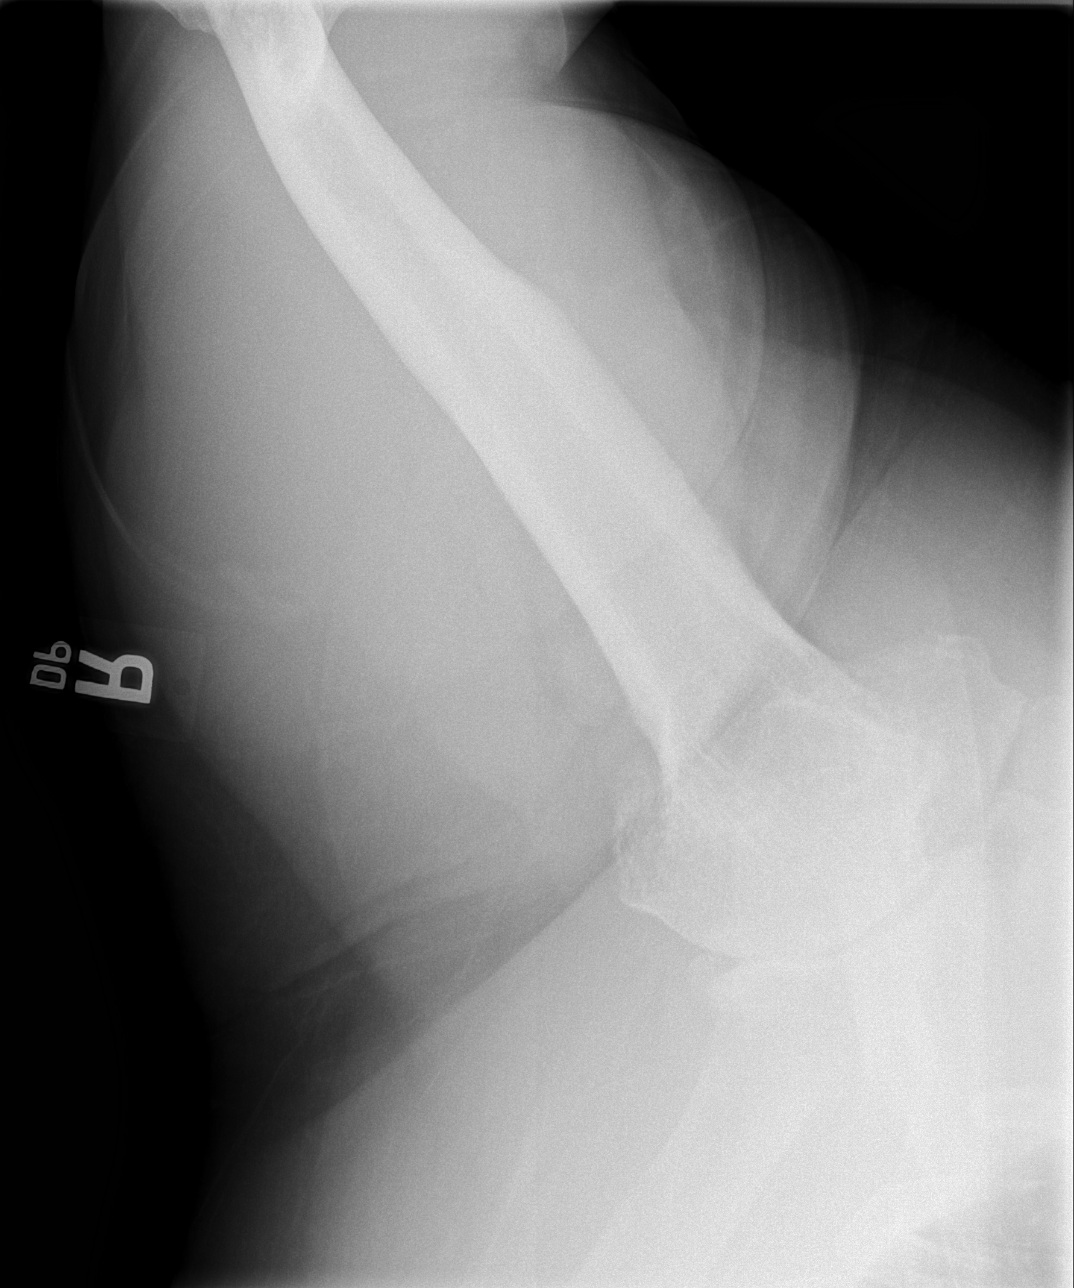

[3 of 3 positions shown; findings below may reference images not displayed]

FINDINGS: No acute bony or joint abnormality is identified. The patient has
advanced glenohumeral osteoarthritis with a large osteophyte off the
humeral head. Both the humeral head and glenoid appear remodeled.
Moderate to moderately severe acromioclavicular degenerative change
is seen. A 0.7 cm round calcification projecting in the superior
aspect of the glenohumeral joint is compatible with a loose body.
Imaged lung and ribs are unremarkable.
IMPRESSION: Dominant finding is severe appearing glenohumeral osteoarthritis
with a likely loose body in the joint.

Moderate to moderately severe acromioclavicular osteoarthritis.

## 2017-07-30 DIAGNOSIS — M549 Dorsalgia, unspecified: Secondary | ICD-10-CM | POA: Diagnosis not present

## 2017-07-30 DIAGNOSIS — J309 Allergic rhinitis, unspecified: Secondary | ICD-10-CM | POA: Diagnosis not present

## 2017-07-30 DIAGNOSIS — E871 Hypo-osmolality and hyponatremia: Secondary | ICD-10-CM | POA: Diagnosis not present

## 2017-07-30 DIAGNOSIS — L309 Dermatitis, unspecified: Secondary | ICD-10-CM | POA: Diagnosis not present

## 2017-07-30 DIAGNOSIS — E119 Type 2 diabetes mellitus without complications: Secondary | ICD-10-CM | POA: Diagnosis not present

## 2017-07-30 DIAGNOSIS — I10 Essential (primary) hypertension: Secondary | ICD-10-CM | POA: Diagnosis not present

## 2017-07-30 DIAGNOSIS — E039 Hypothyroidism, unspecified: Secondary | ICD-10-CM | POA: Diagnosis not present

## 2017-07-30 DIAGNOSIS — M159 Polyosteoarthritis, unspecified: Secondary | ICD-10-CM | POA: Diagnosis not present

## 2017-07-30 DIAGNOSIS — E781 Pure hyperglyceridemia: Secondary | ICD-10-CM | POA: Diagnosis not present

## 2017-07-30 DIAGNOSIS — R5382 Chronic fatigue, unspecified: Secondary | ICD-10-CM | POA: Diagnosis not present

## 2017-07-30 DIAGNOSIS — E785 Hyperlipidemia, unspecified: Secondary | ICD-10-CM | POA: Diagnosis not present

## 2017-07-30 DIAGNOSIS — E46 Unspecified protein-calorie malnutrition: Secondary | ICD-10-CM | POA: Diagnosis not present

## 2017-07-30 DIAGNOSIS — E1159 Type 2 diabetes mellitus with other circulatory complications: Secondary | ICD-10-CM | POA: Diagnosis not present

## 2017-07-30 DIAGNOSIS — M810 Age-related osteoporosis without current pathological fracture: Secondary | ICD-10-CM | POA: Diagnosis not present

## 2017-07-30 DIAGNOSIS — B0229 Other postherpetic nervous system involvement: Secondary | ICD-10-CM | POA: Diagnosis not present

## 2017-07-30 DIAGNOSIS — Z780 Asymptomatic menopausal state: Secondary | ICD-10-CM | POA: Diagnosis not present

## 2017-08-06 DIAGNOSIS — F209 Schizophrenia, unspecified: Secondary | ICD-10-CM | POA: Diagnosis not present

## 2017-09-05 DIAGNOSIS — I10 Essential (primary) hypertension: Secondary | ICD-10-CM | POA: Diagnosis not present

## 2017-09-05 DIAGNOSIS — M25511 Pain in right shoulder: Secondary | ICD-10-CM | POA: Diagnosis not present

## 2017-09-05 DIAGNOSIS — E119 Type 2 diabetes mellitus without complications: Secondary | ICD-10-CM | POA: Diagnosis not present

## 2017-09-05 DIAGNOSIS — E781 Pure hyperglyceridemia: Secondary | ICD-10-CM | POA: Diagnosis not present

## 2017-09-13 DIAGNOSIS — I10 Essential (primary) hypertension: Secondary | ICD-10-CM | POA: Diagnosis not present

## 2017-09-13 DIAGNOSIS — M25512 Pain in left shoulder: Secondary | ICD-10-CM | POA: Diagnosis not present

## 2017-09-13 DIAGNOSIS — E119 Type 2 diabetes mellitus without complications: Secondary | ICD-10-CM | POA: Diagnosis not present

## 2017-09-13 DIAGNOSIS — E781 Pure hyperglyceridemia: Secondary | ICD-10-CM | POA: Diagnosis not present

## 2017-09-17 DIAGNOSIS — M19011 Primary osteoarthritis, right shoulder: Secondary | ICD-10-CM | POA: Diagnosis not present

## 2017-09-17 DIAGNOSIS — M25511 Pain in right shoulder: Secondary | ICD-10-CM | POA: Diagnosis not present

## 2017-09-19 DIAGNOSIS — I10 Essential (primary) hypertension: Secondary | ICD-10-CM | POA: Diagnosis not present

## 2017-09-19 DIAGNOSIS — N289 Disorder of kidney and ureter, unspecified: Secondary | ICD-10-CM | POA: Diagnosis not present

## 2017-09-19 DIAGNOSIS — R945 Abnormal results of liver function studies: Secondary | ICD-10-CM | POA: Diagnosis not present

## 2017-09-19 DIAGNOSIS — K5909 Other constipation: Secondary | ICD-10-CM | POA: Diagnosis not present

## 2017-10-08 DIAGNOSIS — F209 Schizophrenia, unspecified: Secondary | ICD-10-CM | POA: Diagnosis not present

## 2017-10-09 DIAGNOSIS — M25512 Pain in left shoulder: Secondary | ICD-10-CM | POA: Diagnosis not present

## 2017-10-09 DIAGNOSIS — M19012 Primary osteoarthritis, left shoulder: Secondary | ICD-10-CM | POA: Diagnosis not present

## 2017-11-07 DIAGNOSIS — M25562 Pain in left knee: Secondary | ICD-10-CM | POA: Diagnosis not present

## 2017-11-09 DIAGNOSIS — E119 Type 2 diabetes mellitus without complications: Secondary | ICD-10-CM | POA: Diagnosis not present

## 2017-11-09 DIAGNOSIS — Z79899 Other long term (current) drug therapy: Secondary | ICD-10-CM | POA: Diagnosis not present

## 2017-11-09 DIAGNOSIS — R531 Weakness: Secondary | ICD-10-CM | POA: Diagnosis not present

## 2017-11-09 DIAGNOSIS — F17210 Nicotine dependence, cigarettes, uncomplicated: Secondary | ICD-10-CM | POA: Diagnosis not present

## 2017-11-09 DIAGNOSIS — Z86711 Personal history of pulmonary embolism: Secondary | ICD-10-CM | POA: Diagnosis not present

## 2017-11-11 DIAGNOSIS — Z79899 Other long term (current) drug therapy: Secondary | ICD-10-CM | POA: Diagnosis not present

## 2017-11-11 DIAGNOSIS — R531 Weakness: Secondary | ICD-10-CM | POA: Diagnosis not present

## 2017-11-20 DIAGNOSIS — I10 Essential (primary) hypertension: Secondary | ICD-10-CM | POA: Diagnosis not present

## 2017-11-20 DIAGNOSIS — K5909 Other constipation: Secondary | ICD-10-CM | POA: Diagnosis not present

## 2017-11-20 DIAGNOSIS — R945 Abnormal results of liver function studies: Secondary | ICD-10-CM | POA: Diagnosis not present

## 2017-11-20 DIAGNOSIS — E119 Type 2 diabetes mellitus without complications: Secondary | ICD-10-CM | POA: Diagnosis not present

## 2017-11-20 DIAGNOSIS — N289 Disorder of kidney and ureter, unspecified: Secondary | ICD-10-CM | POA: Diagnosis not present

## 2017-12-04 DIAGNOSIS — F209 Schizophrenia, unspecified: Secondary | ICD-10-CM | POA: Diagnosis not present

## 2017-12-20 DIAGNOSIS — E119 Type 2 diabetes mellitus without complications: Secondary | ICD-10-CM | POA: Diagnosis not present

## 2017-12-20 DIAGNOSIS — K5909 Other constipation: Secondary | ICD-10-CM | POA: Diagnosis not present

## 2017-12-20 DIAGNOSIS — N289 Disorder of kidney and ureter, unspecified: Secondary | ICD-10-CM | POA: Diagnosis not present

## 2017-12-20 DIAGNOSIS — R945 Abnormal results of liver function studies: Secondary | ICD-10-CM | POA: Diagnosis not present

## 2017-12-20 DIAGNOSIS — I10 Essential (primary) hypertension: Secondary | ICD-10-CM | POA: Diagnosis not present

## 2018-01-02 DIAGNOSIS — F209 Schizophrenia, unspecified: Secondary | ICD-10-CM | POA: Diagnosis not present

## 2018-01-21 DIAGNOSIS — Z1331 Encounter for screening for depression: Secondary | ICD-10-CM | POA: Diagnosis not present

## 2018-01-21 DIAGNOSIS — E119 Type 2 diabetes mellitus without complications: Secondary | ICD-10-CM | POA: Diagnosis not present

## 2018-01-21 DIAGNOSIS — N289 Disorder of kidney and ureter, unspecified: Secondary | ICD-10-CM | POA: Diagnosis not present

## 2018-01-21 DIAGNOSIS — Z1339 Encounter for screening examination for other mental health and behavioral disorders: Secondary | ICD-10-CM | POA: Diagnosis not present

## 2018-01-21 DIAGNOSIS — R945 Abnormal results of liver function studies: Secondary | ICD-10-CM | POA: Diagnosis not present

## 2018-01-21 DIAGNOSIS — I10 Essential (primary) hypertension: Secondary | ICD-10-CM | POA: Diagnosis not present

## 2018-01-21 DIAGNOSIS — K5909 Other constipation: Secondary | ICD-10-CM | POA: Diagnosis not present

## 2018-02-13 DIAGNOSIS — F209 Schizophrenia, unspecified: Secondary | ICD-10-CM | POA: Diagnosis not present

## 2018-02-27 DIAGNOSIS — I10 Essential (primary) hypertension: Secondary | ICD-10-CM | POA: Diagnosis not present

## 2018-02-27 DIAGNOSIS — Z23 Encounter for immunization: Secondary | ICD-10-CM | POA: Diagnosis not present

## 2018-02-27 DIAGNOSIS — K5909 Other constipation: Secondary | ICD-10-CM | POA: Diagnosis not present

## 2018-02-27 DIAGNOSIS — R945 Abnormal results of liver function studies: Secondary | ICD-10-CM | POA: Diagnosis not present

## 2018-02-27 DIAGNOSIS — E119 Type 2 diabetes mellitus without complications: Secondary | ICD-10-CM | POA: Diagnosis not present

## 2018-02-27 DIAGNOSIS — N289 Disorder of kidney and ureter, unspecified: Secondary | ICD-10-CM | POA: Diagnosis not present

## 2018-04-10 DIAGNOSIS — E119 Type 2 diabetes mellitus without complications: Secondary | ICD-10-CM | POA: Diagnosis not present

## 2018-04-10 DIAGNOSIS — R945 Abnormal results of liver function studies: Secondary | ICD-10-CM | POA: Diagnosis not present

## 2018-04-10 DIAGNOSIS — K5909 Other constipation: Secondary | ICD-10-CM | POA: Diagnosis not present

## 2018-04-10 DIAGNOSIS — E039 Hypothyroidism, unspecified: Secondary | ICD-10-CM | POA: Diagnosis not present

## 2018-04-10 DIAGNOSIS — E781 Pure hyperglyceridemia: Secondary | ICD-10-CM | POA: Diagnosis not present

## 2018-04-10 DIAGNOSIS — N289 Disorder of kidney and ureter, unspecified: Secondary | ICD-10-CM | POA: Diagnosis not present

## 2018-04-10 DIAGNOSIS — I10 Essential (primary) hypertension: Secondary | ICD-10-CM | POA: Diagnosis not present

## 2018-04-17 DIAGNOSIS — F209 Schizophrenia, unspecified: Secondary | ICD-10-CM | POA: Diagnosis not present

## 2018-04-29 DIAGNOSIS — M25511 Pain in right shoulder: Secondary | ICD-10-CM | POA: Diagnosis not present

## 2018-04-29 DIAGNOSIS — M19011 Primary osteoarthritis, right shoulder: Secondary | ICD-10-CM | POA: Diagnosis not present

## 2018-05-15 DIAGNOSIS — F209 Schizophrenia, unspecified: Secondary | ICD-10-CM | POA: Diagnosis not present

## 2018-05-19 DIAGNOSIS — R5381 Other malaise: Secondary | ICD-10-CM | POA: Diagnosis not present

## 2018-05-19 DIAGNOSIS — R0902 Hypoxemia: Secondary | ICD-10-CM | POA: Diagnosis not present

## 2018-05-19 DIAGNOSIS — J189 Pneumonia, unspecified organism: Secondary | ICD-10-CM | POA: Diagnosis not present

## 2018-05-19 DIAGNOSIS — R110 Nausea: Secondary | ICD-10-CM | POA: Diagnosis not present

## 2018-05-19 DIAGNOSIS — R05 Cough: Secondary | ICD-10-CM | POA: Diagnosis not present

## 2018-05-19 DIAGNOSIS — R000 Tachycardia, unspecified: Secondary | ICD-10-CM | POA: Diagnosis not present

## 2018-05-19 DIAGNOSIS — F17210 Nicotine dependence, cigarettes, uncomplicated: Secondary | ICD-10-CM | POA: Diagnosis not present

## 2018-05-19 DIAGNOSIS — R7989 Other specified abnormal findings of blood chemistry: Secondary | ICD-10-CM | POA: Diagnosis not present

## 2018-05-20 DIAGNOSIS — F79 Unspecified intellectual disabilities: Secondary | ICD-10-CM

## 2018-05-20 DIAGNOSIS — E1169 Type 2 diabetes mellitus with other specified complication: Secondary | ICD-10-CM | POA: Diagnosis not present

## 2018-05-20 DIAGNOSIS — E039 Hypothyroidism, unspecified: Secondary | ICD-10-CM | POA: Diagnosis not present

## 2018-05-20 DIAGNOSIS — J189 Pneumonia, unspecified organism: Secondary | ICD-10-CM

## 2018-05-20 DIAGNOSIS — R7989 Other specified abnormal findings of blood chemistry: Secondary | ICD-10-CM | POA: Diagnosis not present

## 2018-05-28 DIAGNOSIS — M25512 Pain in left shoulder: Secondary | ICD-10-CM | POA: Diagnosis not present

## 2018-06-03 DIAGNOSIS — F29 Unspecified psychosis not due to a substance or known physiological condition: Secondary | ICD-10-CM | POA: Diagnosis not present

## 2018-06-18 DIAGNOSIS — F29 Unspecified psychosis not due to a substance or known physiological condition: Secondary | ICD-10-CM | POA: Diagnosis not present

## 2018-06-19 DIAGNOSIS — F209 Schizophrenia, unspecified: Secondary | ICD-10-CM | POA: Diagnosis not present

## 2018-06-20 DIAGNOSIS — I2699 Other pulmonary embolism without acute cor pulmonale: Secondary | ICD-10-CM | POA: Diagnosis not present

## 2018-06-20 DIAGNOSIS — K5909 Other constipation: Secondary | ICD-10-CM | POA: Diagnosis not present

## 2018-06-20 DIAGNOSIS — R945 Abnormal results of liver function studies: Secondary | ICD-10-CM | POA: Diagnosis not present

## 2018-06-20 DIAGNOSIS — N289 Disorder of kidney and ureter, unspecified: Secondary | ICD-10-CM | POA: Diagnosis not present

## 2018-07-04 DIAGNOSIS — E119 Type 2 diabetes mellitus without complications: Secondary | ICD-10-CM | POA: Diagnosis not present

## 2018-07-04 DIAGNOSIS — N289 Disorder of kidney and ureter, unspecified: Secondary | ICD-10-CM | POA: Diagnosis not present

## 2018-07-04 DIAGNOSIS — I10 Essential (primary) hypertension: Secondary | ICD-10-CM | POA: Diagnosis not present

## 2018-07-04 DIAGNOSIS — E669 Obesity, unspecified: Secondary | ICD-10-CM | POA: Diagnosis not present

## 2018-07-04 DIAGNOSIS — K5909 Other constipation: Secondary | ICD-10-CM | POA: Diagnosis not present

## 2018-07-04 DIAGNOSIS — E291 Testicular hypofunction: Secondary | ICD-10-CM | POA: Diagnosis not present

## 2018-07-04 DIAGNOSIS — R945 Abnormal results of liver function studies: Secondary | ICD-10-CM | POA: Diagnosis not present

## 2018-08-01 DIAGNOSIS — F29 Unspecified psychosis not due to a substance or known physiological condition: Secondary | ICD-10-CM | POA: Diagnosis not present

## 2018-08-02 DIAGNOSIS — M19011 Primary osteoarthritis, right shoulder: Secondary | ICD-10-CM | POA: Diagnosis not present

## 2018-08-12 DIAGNOSIS — F29 Unspecified psychosis not due to a substance or known physiological condition: Secondary | ICD-10-CM | POA: Diagnosis not present

## 2018-09-06 DIAGNOSIS — F29 Unspecified psychosis not due to a substance or known physiological condition: Secondary | ICD-10-CM | POA: Diagnosis not present

## 2018-09-10 DIAGNOSIS — R945 Abnormal results of liver function studies: Secondary | ICD-10-CM | POA: Diagnosis not present

## 2018-09-10 DIAGNOSIS — N289 Disorder of kidney and ureter, unspecified: Secondary | ICD-10-CM | POA: Diagnosis not present

## 2018-09-10 DIAGNOSIS — K5909 Other constipation: Secondary | ICD-10-CM | POA: Diagnosis not present

## 2018-09-27 DIAGNOSIS — F29 Unspecified psychosis not due to a substance or known physiological condition: Secondary | ICD-10-CM | POA: Diagnosis not present

## 2018-10-11 DIAGNOSIS — K5909 Other constipation: Secondary | ICD-10-CM | POA: Diagnosis not present

## 2018-10-11 DIAGNOSIS — E781 Pure hyperglyceridemia: Secondary | ICD-10-CM | POA: Diagnosis not present

## 2018-10-11 DIAGNOSIS — E039 Hypothyroidism, unspecified: Secondary | ICD-10-CM | POA: Diagnosis not present

## 2018-10-11 DIAGNOSIS — E119 Type 2 diabetes mellitus without complications: Secondary | ICD-10-CM | POA: Diagnosis not present

## 2018-10-11 DIAGNOSIS — I10 Essential (primary) hypertension: Secondary | ICD-10-CM | POA: Diagnosis not present

## 2018-10-11 DIAGNOSIS — R945 Abnormal results of liver function studies: Secondary | ICD-10-CM | POA: Diagnosis not present

## 2018-10-11 DIAGNOSIS — N289 Disorder of kidney and ureter, unspecified: Secondary | ICD-10-CM | POA: Diagnosis not present

## 2018-10-15 DIAGNOSIS — F29 Unspecified psychosis not due to a substance or known physiological condition: Secondary | ICD-10-CM | POA: Diagnosis not present

## 2018-10-22 DIAGNOSIS — F29 Unspecified psychosis not due to a substance or known physiological condition: Secondary | ICD-10-CM | POA: Diagnosis not present

## 2018-11-08 DIAGNOSIS — R945 Abnormal results of liver function studies: Secondary | ICD-10-CM | POA: Diagnosis not present

## 2018-11-08 DIAGNOSIS — N289 Disorder of kidney and ureter, unspecified: Secondary | ICD-10-CM | POA: Diagnosis not present

## 2018-11-08 DIAGNOSIS — K5909 Other constipation: Secondary | ICD-10-CM | POA: Diagnosis not present

## 2018-11-08 DIAGNOSIS — E119 Type 2 diabetes mellitus without complications: Secondary | ICD-10-CM | POA: Diagnosis not present

## 2018-11-13 DIAGNOSIS — M19011 Primary osteoarthritis, right shoulder: Secondary | ICD-10-CM | POA: Diagnosis not present

## 2018-11-13 DIAGNOSIS — M19012 Primary osteoarthritis, left shoulder: Secondary | ICD-10-CM | POA: Diagnosis not present

## 2018-11-15 DIAGNOSIS — N289 Disorder of kidney and ureter, unspecified: Secondary | ICD-10-CM | POA: Diagnosis not present

## 2018-11-15 DIAGNOSIS — E119 Type 2 diabetes mellitus without complications: Secondary | ICD-10-CM | POA: Diagnosis not present

## 2018-11-15 DIAGNOSIS — K5909 Other constipation: Secondary | ICD-10-CM | POA: Diagnosis not present

## 2018-11-15 DIAGNOSIS — R5382 Chronic fatigue, unspecified: Secondary | ICD-10-CM | POA: Diagnosis not present

## 2018-11-18 DIAGNOSIS — K5909 Other constipation: Secondary | ICD-10-CM | POA: Diagnosis not present

## 2018-11-18 DIAGNOSIS — R945 Abnormal results of liver function studies: Secondary | ICD-10-CM | POA: Diagnosis not present

## 2018-11-18 DIAGNOSIS — E039 Hypothyroidism, unspecified: Secondary | ICD-10-CM | POA: Diagnosis not present

## 2018-11-18 DIAGNOSIS — N289 Disorder of kidney and ureter, unspecified: Secondary | ICD-10-CM | POA: Diagnosis not present

## 2018-11-21 DIAGNOSIS — N289 Disorder of kidney and ureter, unspecified: Secondary | ICD-10-CM | POA: Diagnosis not present

## 2018-11-21 DIAGNOSIS — R945 Abnormal results of liver function studies: Secondary | ICD-10-CM | POA: Diagnosis not present

## 2018-11-21 DIAGNOSIS — K5909 Other constipation: Secondary | ICD-10-CM | POA: Diagnosis not present

## 2018-11-21 DIAGNOSIS — E039 Hypothyroidism, unspecified: Secondary | ICD-10-CM | POA: Diagnosis not present

## 2018-11-25 DIAGNOSIS — M25511 Pain in right shoulder: Secondary | ICD-10-CM | POA: Diagnosis not present

## 2018-11-25 DIAGNOSIS — M19011 Primary osteoarthritis, right shoulder: Secondary | ICD-10-CM | POA: Diagnosis not present

## 2018-11-28 DIAGNOSIS — M19011 Primary osteoarthritis, right shoulder: Secondary | ICD-10-CM | POA: Diagnosis not present

## 2018-12-05 DIAGNOSIS — Z01818 Encounter for other preprocedural examination: Secondary | ICD-10-CM | POA: Diagnosis not present

## 2018-12-05 DIAGNOSIS — E039 Hypothyroidism, unspecified: Secondary | ICD-10-CM | POA: Diagnosis not present

## 2018-12-05 DIAGNOSIS — E119 Type 2 diabetes mellitus without complications: Secondary | ICD-10-CM | POA: Diagnosis not present

## 2018-12-05 DIAGNOSIS — I10 Essential (primary) hypertension: Secondary | ICD-10-CM | POA: Diagnosis not present

## 2018-12-05 DIAGNOSIS — N289 Disorder of kidney and ureter, unspecified: Secondary | ICD-10-CM | POA: Diagnosis not present

## 2018-12-05 DIAGNOSIS — K5909 Other constipation: Secondary | ICD-10-CM | POA: Diagnosis not present

## 2018-12-10 DIAGNOSIS — E559 Vitamin D deficiency, unspecified: Secondary | ICD-10-CM | POA: Diagnosis not present

## 2018-12-10 DIAGNOSIS — E039 Hypothyroidism, unspecified: Secondary | ICD-10-CM | POA: Diagnosis not present

## 2018-12-10 DIAGNOSIS — N289 Disorder of kidney and ureter, unspecified: Secondary | ICD-10-CM | POA: Diagnosis not present

## 2018-12-10 DIAGNOSIS — Z01818 Encounter for other preprocedural examination: Secondary | ICD-10-CM | POA: Diagnosis not present

## 2018-12-10 DIAGNOSIS — Z79899 Other long term (current) drug therapy: Secondary | ICD-10-CM | POA: Diagnosis not present

## 2018-12-10 DIAGNOSIS — M79609 Pain in unspecified limb: Secondary | ICD-10-CM | POA: Diagnosis not present

## 2018-12-10 DIAGNOSIS — M25511 Pain in right shoulder: Secondary | ICD-10-CM | POA: Diagnosis not present

## 2018-12-10 DIAGNOSIS — M25512 Pain in left shoulder: Secondary | ICD-10-CM | POA: Diagnosis not present

## 2018-12-10 DIAGNOSIS — K5909 Other constipation: Secondary | ICD-10-CM | POA: Diagnosis not present

## 2018-12-26 DIAGNOSIS — E039 Hypothyroidism, unspecified: Secondary | ICD-10-CM | POA: Diagnosis not present

## 2018-12-26 DIAGNOSIS — R945 Abnormal results of liver function studies: Secondary | ICD-10-CM | POA: Diagnosis not present

## 2018-12-26 DIAGNOSIS — N289 Disorder of kidney and ureter, unspecified: Secondary | ICD-10-CM | POA: Diagnosis not present

## 2018-12-26 DIAGNOSIS — K5909 Other constipation: Secondary | ICD-10-CM | POA: Diagnosis not present

## 2019-01-09 DIAGNOSIS — N289 Disorder of kidney and ureter, unspecified: Secondary | ICD-10-CM | POA: Diagnosis not present

## 2019-01-09 DIAGNOSIS — E039 Hypothyroidism, unspecified: Secondary | ICD-10-CM | POA: Diagnosis not present

## 2019-01-09 DIAGNOSIS — R945 Abnormal results of liver function studies: Secondary | ICD-10-CM | POA: Diagnosis not present

## 2019-01-09 DIAGNOSIS — K5909 Other constipation: Secondary | ICD-10-CM | POA: Diagnosis not present

## 2019-01-14 DIAGNOSIS — Z79899 Other long term (current) drug therapy: Secondary | ICD-10-CM | POA: Diagnosis not present

## 2019-01-14 DIAGNOSIS — N189 Chronic kidney disease, unspecified: Secondary | ICD-10-CM | POA: Diagnosis not present

## 2019-01-14 DIAGNOSIS — F429 Obsessive-compulsive disorder, unspecified: Secondary | ICD-10-CM | POA: Diagnosis present

## 2019-01-14 DIAGNOSIS — Z87891 Personal history of nicotine dependence: Secondary | ICD-10-CM | POA: Diagnosis not present

## 2019-01-14 DIAGNOSIS — M19011 Primary osteoarthritis, right shoulder: Secondary | ICD-10-CM | POA: Diagnosis present

## 2019-01-14 DIAGNOSIS — E119 Type 2 diabetes mellitus without complications: Secondary | ICD-10-CM | POA: Diagnosis present

## 2019-01-14 DIAGNOSIS — G8918 Other acute postprocedural pain: Secondary | ICD-10-CM | POA: Diagnosis not present

## 2019-01-14 DIAGNOSIS — Z6836 Body mass index (BMI) 36.0-36.9, adult: Secondary | ICD-10-CM | POA: Diagnosis not present

## 2019-01-14 DIAGNOSIS — I10 Essential (primary) hypertension: Secondary | ICD-10-CM | POA: Diagnosis present

## 2019-01-14 DIAGNOSIS — E039 Hypothyroidism, unspecified: Secondary | ICD-10-CM | POA: Diagnosis present

## 2019-01-14 DIAGNOSIS — Z7982 Long term (current) use of aspirin: Secondary | ICD-10-CM | POA: Diagnosis not present

## 2019-01-14 DIAGNOSIS — Z96611 Presence of right artificial shoulder joint: Secondary | ICD-10-CM | POA: Diagnosis not present

## 2019-01-14 DIAGNOSIS — E7800 Pure hypercholesterolemia, unspecified: Secondary | ICD-10-CM | POA: Diagnosis present

## 2019-01-14 DIAGNOSIS — Z471 Aftercare following joint replacement surgery: Secondary | ICD-10-CM | POA: Diagnosis not present

## 2019-01-14 DIAGNOSIS — F419 Anxiety disorder, unspecified: Secondary | ICD-10-CM | POA: Diagnosis present

## 2019-01-14 DIAGNOSIS — Z86711 Personal history of pulmonary embolism: Secondary | ICD-10-CM | POA: Diagnosis not present

## 2019-01-14 DIAGNOSIS — E1122 Type 2 diabetes mellitus with diabetic chronic kidney disease: Secondary | ICD-10-CM | POA: Diagnosis not present

## 2019-01-21 DIAGNOSIS — M25511 Pain in right shoulder: Secondary | ICD-10-CM | POA: Diagnosis not present

## 2019-01-21 DIAGNOSIS — M25611 Stiffness of right shoulder, not elsewhere classified: Secondary | ICD-10-CM | POA: Diagnosis not present

## 2019-01-21 DIAGNOSIS — M6281 Muscle weakness (generalized): Secondary | ICD-10-CM | POA: Diagnosis not present

## 2019-01-27 DIAGNOSIS — M19012 Primary osteoarthritis, left shoulder: Secondary | ICD-10-CM | POA: Diagnosis not present

## 2019-01-27 DIAGNOSIS — M25511 Pain in right shoulder: Secondary | ICD-10-CM | POA: Diagnosis not present

## 2019-01-28 DIAGNOSIS — M25511 Pain in right shoulder: Secondary | ICD-10-CM | POA: Diagnosis not present

## 2019-01-28 DIAGNOSIS — M25611 Stiffness of right shoulder, not elsewhere classified: Secondary | ICD-10-CM | POA: Diagnosis not present

## 2019-01-28 DIAGNOSIS — M6281 Muscle weakness (generalized): Secondary | ICD-10-CM | POA: Diagnosis not present

## 2019-01-29 DIAGNOSIS — Z23 Encounter for immunization: Secondary | ICD-10-CM | POA: Diagnosis not present

## 2019-01-29 DIAGNOSIS — Z125 Encounter for screening for malignant neoplasm of prostate: Secondary | ICD-10-CM | POA: Diagnosis not present

## 2019-01-29 DIAGNOSIS — Z6835 Body mass index (BMI) 35.0-35.9, adult: Secondary | ICD-10-CM | POA: Diagnosis not present

## 2019-01-29 DIAGNOSIS — Z9181 History of falling: Secondary | ICD-10-CM | POA: Diagnosis not present

## 2019-01-29 DIAGNOSIS — Z1331 Encounter for screening for depression: Secondary | ICD-10-CM | POA: Diagnosis not present

## 2019-01-29 DIAGNOSIS — Z0000 Encounter for general adult medical examination without abnormal findings: Secondary | ICD-10-CM | POA: Diagnosis not present

## 2019-01-29 DIAGNOSIS — E785 Hyperlipidemia, unspecified: Secondary | ICD-10-CM | POA: Diagnosis not present

## 2019-01-31 DIAGNOSIS — R52 Pain, unspecified: Secondary | ICD-10-CM | POA: Diagnosis not present

## 2019-01-31 DIAGNOSIS — M545 Low back pain: Secondary | ICD-10-CM | POA: Diagnosis not present

## 2019-01-31 DIAGNOSIS — N23 Unspecified renal colic: Secondary | ICD-10-CM | POA: Diagnosis not present

## 2019-02-05 DIAGNOSIS — M25611 Stiffness of right shoulder, not elsewhere classified: Secondary | ICD-10-CM | POA: Diagnosis not present

## 2019-02-05 DIAGNOSIS — M25511 Pain in right shoulder: Secondary | ICD-10-CM | POA: Diagnosis not present

## 2019-02-05 DIAGNOSIS — M6281 Muscle weakness (generalized): Secondary | ICD-10-CM | POA: Diagnosis not present

## 2019-02-20 DIAGNOSIS — M6281 Muscle weakness (generalized): Secondary | ICD-10-CM | POA: Diagnosis not present

## 2019-02-20 DIAGNOSIS — M25611 Stiffness of right shoulder, not elsewhere classified: Secondary | ICD-10-CM | POA: Diagnosis not present

## 2019-02-20 DIAGNOSIS — M25511 Pain in right shoulder: Secondary | ICD-10-CM | POA: Diagnosis not present

## 2019-02-26 DIAGNOSIS — M25511 Pain in right shoulder: Secondary | ICD-10-CM | POA: Diagnosis not present

## 2019-02-26 DIAGNOSIS — M25611 Stiffness of right shoulder, not elsewhere classified: Secondary | ICD-10-CM | POA: Diagnosis not present

## 2019-02-26 DIAGNOSIS — M6281 Muscle weakness (generalized): Secondary | ICD-10-CM | POA: Diagnosis not present

## 2019-03-10 DIAGNOSIS — E039 Hypothyroidism, unspecified: Secondary | ICD-10-CM | POA: Diagnosis not present

## 2019-03-10 DIAGNOSIS — N289 Disorder of kidney and ureter, unspecified: Secondary | ICD-10-CM | POA: Diagnosis not present

## 2019-03-10 DIAGNOSIS — E119 Type 2 diabetes mellitus without complications: Secondary | ICD-10-CM | POA: Diagnosis not present

## 2019-03-10 DIAGNOSIS — K5909 Other constipation: Secondary | ICD-10-CM | POA: Diagnosis not present

## 2019-03-10 DIAGNOSIS — R945 Abnormal results of liver function studies: Secondary | ICD-10-CM | POA: Diagnosis not present

## 2019-03-12 DIAGNOSIS — M25511 Pain in right shoulder: Secondary | ICD-10-CM | POA: Diagnosis not present

## 2019-03-12 DIAGNOSIS — M6281 Muscle weakness (generalized): Secondary | ICD-10-CM | POA: Diagnosis not present

## 2019-03-12 DIAGNOSIS — M25611 Stiffness of right shoulder, not elsewhere classified: Secondary | ICD-10-CM | POA: Diagnosis not present

## 2019-03-18 DIAGNOSIS — F209 Schizophrenia, unspecified: Secondary | ICD-10-CM | POA: Diagnosis not present

## 2019-04-07 DIAGNOSIS — E039 Hypothyroidism, unspecified: Secondary | ICD-10-CM | POA: Diagnosis not present

## 2019-04-07 DIAGNOSIS — K5909 Other constipation: Secondary | ICD-10-CM | POA: Diagnosis not present

## 2019-04-07 DIAGNOSIS — E119 Type 2 diabetes mellitus without complications: Secondary | ICD-10-CM | POA: Diagnosis not present

## 2019-04-07 DIAGNOSIS — N289 Disorder of kidney and ureter, unspecified: Secondary | ICD-10-CM | POA: Diagnosis not present

## 2019-05-30 DIAGNOSIS — K5909 Other constipation: Secondary | ICD-10-CM | POA: Diagnosis not present

## 2019-05-30 DIAGNOSIS — E039 Hypothyroidism, unspecified: Secondary | ICD-10-CM | POA: Diagnosis not present

## 2019-05-30 DIAGNOSIS — N1832 Chronic kidney disease, stage 3b: Secondary | ICD-10-CM | POA: Diagnosis not present

## 2019-05-30 DIAGNOSIS — R945 Abnormal results of liver function studies: Secondary | ICD-10-CM | POA: Diagnosis not present

## 2019-06-30 DIAGNOSIS — F209 Schizophrenia, unspecified: Secondary | ICD-10-CM | POA: Diagnosis not present

## 2019-07-16 DIAGNOSIS — I2699 Other pulmonary embolism without acute cor pulmonale: Secondary | ICD-10-CM | POA: Diagnosis not present

## 2019-07-16 DIAGNOSIS — K5900 Constipation, unspecified: Secondary | ICD-10-CM | POA: Diagnosis not present

## 2019-07-16 DIAGNOSIS — R600 Localized edema: Secondary | ICD-10-CM | POA: Diagnosis not present

## 2019-07-16 DIAGNOSIS — R945 Abnormal results of liver function studies: Secondary | ICD-10-CM | POA: Diagnosis not present

## 2019-08-20 DIAGNOSIS — R945 Abnormal results of liver function studies: Secondary | ICD-10-CM | POA: Diagnosis not present

## 2019-08-20 DIAGNOSIS — R600 Localized edema: Secondary | ICD-10-CM | POA: Diagnosis not present

## 2019-08-20 DIAGNOSIS — K5909 Other constipation: Secondary | ICD-10-CM | POA: Diagnosis not present

## 2019-08-20 DIAGNOSIS — I2699 Other pulmonary embolism without acute cor pulmonale: Secondary | ICD-10-CM | POA: Diagnosis not present

## 2019-09-01 DIAGNOSIS — S0001XA Abrasion of scalp, initial encounter: Secondary | ICD-10-CM | POA: Diagnosis not present

## 2019-09-01 DIAGNOSIS — Z23 Encounter for immunization: Secondary | ICD-10-CM | POA: Diagnosis not present

## 2019-09-01 DIAGNOSIS — W19XXXA Unspecified fall, initial encounter: Secondary | ICD-10-CM | POA: Diagnosis not present

## 2019-09-01 DIAGNOSIS — S199XXA Unspecified injury of neck, initial encounter: Secondary | ICD-10-CM | POA: Diagnosis not present

## 2019-09-01 DIAGNOSIS — S0102XA Laceration with foreign body of scalp, initial encounter: Secondary | ICD-10-CM | POA: Diagnosis not present

## 2019-09-01 DIAGNOSIS — R0902 Hypoxemia: Secondary | ICD-10-CM | POA: Diagnosis not present

## 2019-09-01 DIAGNOSIS — S0101XA Laceration without foreign body of scalp, initial encounter: Secondary | ICD-10-CM | POA: Diagnosis not present

## 2019-09-15 DIAGNOSIS — I10 Essential (primary) hypertension: Secondary | ICD-10-CM | POA: Diagnosis not present

## 2019-09-15 DIAGNOSIS — R600 Localized edema: Secondary | ICD-10-CM | POA: Diagnosis not present

## 2019-09-15 DIAGNOSIS — K5909 Other constipation: Secondary | ICD-10-CM | POA: Diagnosis not present

## 2019-09-15 DIAGNOSIS — R945 Abnormal results of liver function studies: Secondary | ICD-10-CM | POA: Diagnosis not present

## 2019-09-15 DIAGNOSIS — I2699 Other pulmonary embolism without acute cor pulmonale: Secondary | ICD-10-CM | POA: Diagnosis not present

## 2019-09-15 DIAGNOSIS — E119 Type 2 diabetes mellitus without complications: Secondary | ICD-10-CM | POA: Diagnosis not present

## 2019-10-08 DIAGNOSIS — R600 Localized edema: Secondary | ICD-10-CM | POA: Diagnosis not present

## 2019-10-08 DIAGNOSIS — R945 Abnormal results of liver function studies: Secondary | ICD-10-CM | POA: Diagnosis not present

## 2019-10-08 DIAGNOSIS — K5909 Other constipation: Secondary | ICD-10-CM | POA: Diagnosis not present

## 2019-10-08 DIAGNOSIS — I2699 Other pulmonary embolism without acute cor pulmonale: Secondary | ICD-10-CM | POA: Diagnosis not present

## 2019-11-14 DIAGNOSIS — N289 Disorder of kidney and ureter, unspecified: Secondary | ICD-10-CM | POA: Diagnosis not present

## 2019-11-14 DIAGNOSIS — I10 Essential (primary) hypertension: Secondary | ICD-10-CM | POA: Diagnosis not present

## 2019-11-14 DIAGNOSIS — R42 Dizziness and giddiness: Secondary | ICD-10-CM | POA: Diagnosis not present

## 2019-11-14 DIAGNOSIS — E039 Hypothyroidism, unspecified: Secondary | ICD-10-CM | POA: Diagnosis not present

## 2019-11-14 DIAGNOSIS — R55 Syncope and collapse: Secondary | ICD-10-CM | POA: Diagnosis not present

## 2019-11-14 DIAGNOSIS — R0902 Hypoxemia: Secondary | ICD-10-CM | POA: Diagnosis not present

## 2019-11-14 DIAGNOSIS — D649 Anemia, unspecified: Secondary | ICD-10-CM | POA: Diagnosis not present

## 2019-11-14 DIAGNOSIS — E876 Hypokalemia: Secondary | ICD-10-CM | POA: Diagnosis not present

## 2019-11-14 DIAGNOSIS — R4020 Unspecified coma: Secondary | ICD-10-CM | POA: Diagnosis not present

## 2019-11-15 DIAGNOSIS — Z87891 Personal history of nicotine dependence: Secondary | ICD-10-CM | POA: Diagnosis not present

## 2019-11-15 DIAGNOSIS — N179 Acute kidney failure, unspecified: Secondary | ICD-10-CM | POA: Diagnosis not present

## 2019-11-15 DIAGNOSIS — E785 Hyperlipidemia, unspecified: Secondary | ICD-10-CM | POA: Diagnosis present

## 2019-11-15 DIAGNOSIS — F200 Paranoid schizophrenia: Secondary | ICD-10-CM | POA: Diagnosis not present

## 2019-11-15 DIAGNOSIS — D509 Iron deficiency anemia, unspecified: Secondary | ICD-10-CM | POA: Diagnosis present

## 2019-11-15 DIAGNOSIS — N289 Disorder of kidney and ureter, unspecified: Secondary | ICD-10-CM | POA: Diagnosis not present

## 2019-11-15 DIAGNOSIS — N3289 Other specified disorders of bladder: Secondary | ICD-10-CM | POA: Diagnosis not present

## 2019-11-15 DIAGNOSIS — Z7984 Long term (current) use of oral hypoglycemic drugs: Secondary | ICD-10-CM | POA: Diagnosis not present

## 2019-11-15 DIAGNOSIS — N2889 Other specified disorders of kidney and ureter: Secondary | ICD-10-CM | POA: Diagnosis not present

## 2019-11-15 DIAGNOSIS — E876 Hypokalemia: Secondary | ICD-10-CM | POA: Diagnosis present

## 2019-11-15 DIAGNOSIS — N178 Other acute kidney failure: Secondary | ICD-10-CM | POA: Diagnosis present

## 2019-11-15 DIAGNOSIS — E119 Type 2 diabetes mellitus without complications: Secondary | ICD-10-CM | POA: Diagnosis present

## 2019-11-15 DIAGNOSIS — Z8673 Personal history of transient ischemic attack (TIA), and cerebral infarction without residual deficits: Secondary | ICD-10-CM | POA: Diagnosis not present

## 2019-11-15 DIAGNOSIS — E039 Hypothyroidism, unspecified: Secondary | ICD-10-CM | POA: Diagnosis not present

## 2019-11-15 DIAGNOSIS — I129 Hypertensive chronic kidney disease with stage 1 through stage 4 chronic kidney disease, or unspecified chronic kidney disease: Secondary | ICD-10-CM | POA: Diagnosis present

## 2019-11-15 DIAGNOSIS — Z79899 Other long term (current) drug therapy: Secondary | ICD-10-CM | POA: Diagnosis not present

## 2019-11-15 DIAGNOSIS — Z79890 Hormone replacement therapy (postmenopausal): Secondary | ICD-10-CM | POA: Diagnosis not present

## 2019-11-15 DIAGNOSIS — F319 Bipolar disorder, unspecified: Secondary | ICD-10-CM | POA: Diagnosis not present

## 2019-11-15 DIAGNOSIS — D649 Anemia, unspecified: Secondary | ICD-10-CM | POA: Diagnosis not present

## 2019-11-15 DIAGNOSIS — Z6841 Body Mass Index (BMI) 40.0 and over, adult: Secondary | ICD-10-CM | POA: Diagnosis not present

## 2019-11-15 DIAGNOSIS — N200 Calculus of kidney: Secondary | ICD-10-CM | POA: Diagnosis not present

## 2019-11-15 DIAGNOSIS — N183 Chronic kidney disease, stage 3 unspecified: Secondary | ICD-10-CM | POA: Diagnosis present

## 2019-11-15 DIAGNOSIS — R252 Cramp and spasm: Secondary | ICD-10-CM | POA: Diagnosis not present

## 2020-01-27 DIAGNOSIS — E119 Type 2 diabetes mellitus without complications: Secondary | ICD-10-CM | POA: Diagnosis not present

## 2020-01-27 DIAGNOSIS — E291 Testicular hypofunction: Secondary | ICD-10-CM | POA: Diagnosis not present

## 2020-01-27 DIAGNOSIS — K5909 Other constipation: Secondary | ICD-10-CM | POA: Diagnosis not present

## 2020-01-27 DIAGNOSIS — E039 Hypothyroidism, unspecified: Secondary | ICD-10-CM | POA: Diagnosis not present

## 2020-01-27 DIAGNOSIS — R945 Abnormal results of liver function studies: Secondary | ICD-10-CM | POA: Diagnosis not present

## 2020-01-27 DIAGNOSIS — I10 Essential (primary) hypertension: Secondary | ICD-10-CM | POA: Diagnosis not present

## 2020-01-27 DIAGNOSIS — E781 Pure hyperglyceridemia: Secondary | ICD-10-CM | POA: Diagnosis not present

## 2020-01-27 DIAGNOSIS — I2699 Other pulmonary embolism without acute cor pulmonale: Secondary | ICD-10-CM | POA: Diagnosis not present

## 2020-01-27 DIAGNOSIS — R5382 Chronic fatigue, unspecified: Secondary | ICD-10-CM | POA: Diagnosis not present

## 2020-01-27 DIAGNOSIS — R600 Localized edema: Secondary | ICD-10-CM | POA: Diagnosis not present

## 2020-01-27 DIAGNOSIS — N1832 Chronic kidney disease, stage 3b: Secondary | ICD-10-CM | POA: Diagnosis not present

## 2020-02-11 DIAGNOSIS — Z23 Encounter for immunization: Secondary | ICD-10-CM | POA: Diagnosis not present

## 2020-02-11 DIAGNOSIS — E119 Type 2 diabetes mellitus without complications: Secondary | ICD-10-CM | POA: Diagnosis not present

## 2020-02-11 DIAGNOSIS — I2699 Other pulmonary embolism without acute cor pulmonale: Secondary | ICD-10-CM | POA: Diagnosis not present

## 2020-02-11 DIAGNOSIS — E039 Hypothyroidism, unspecified: Secondary | ICD-10-CM | POA: Diagnosis not present

## 2020-02-11 DIAGNOSIS — E291 Testicular hypofunction: Secondary | ICD-10-CM | POA: Diagnosis not present

## 2020-02-11 DIAGNOSIS — K5909 Other constipation: Secondary | ICD-10-CM | POA: Diagnosis not present

## 2020-02-11 DIAGNOSIS — Z1331 Encounter for screening for depression: Secondary | ICD-10-CM | POA: Diagnosis not present

## 2020-02-11 DIAGNOSIS — N1832 Chronic kidney disease, stage 3b: Secondary | ICD-10-CM | POA: Diagnosis not present

## 2020-02-11 DIAGNOSIS — E781 Pure hyperglyceridemia: Secondary | ICD-10-CM | POA: Diagnosis not present

## 2020-02-11 DIAGNOSIS — R945 Abnormal results of liver function studies: Secondary | ICD-10-CM | POA: Diagnosis not present

## 2020-02-11 DIAGNOSIS — I10 Essential (primary) hypertension: Secondary | ICD-10-CM | POA: Diagnosis not present

## 2020-02-11 DIAGNOSIS — M25562 Pain in left knee: Secondary | ICD-10-CM | POA: Diagnosis not present

## 2020-02-18 DIAGNOSIS — F419 Anxiety disorder, unspecified: Secondary | ICD-10-CM | POA: Diagnosis not present

## 2020-02-18 DIAGNOSIS — I10 Essential (primary) hypertension: Secondary | ICD-10-CM | POA: Diagnosis not present

## 2020-02-18 DIAGNOSIS — K5909 Other constipation: Secondary | ICD-10-CM | POA: Diagnosis not present

## 2020-02-18 DIAGNOSIS — I2699 Other pulmonary embolism without acute cor pulmonale: Secondary | ICD-10-CM | POA: Diagnosis not present

## 2020-02-18 DIAGNOSIS — E291 Testicular hypofunction: Secondary | ICD-10-CM | POA: Diagnosis not present

## 2020-02-18 DIAGNOSIS — E119 Type 2 diabetes mellitus without complications: Secondary | ICD-10-CM | POA: Diagnosis not present

## 2020-02-18 DIAGNOSIS — E039 Hypothyroidism, unspecified: Secondary | ICD-10-CM | POA: Diagnosis not present

## 2020-02-18 DIAGNOSIS — R945 Abnormal results of liver function studies: Secondary | ICD-10-CM | POA: Diagnosis not present

## 2020-02-18 DIAGNOSIS — E781 Pure hyperglyceridemia: Secondary | ICD-10-CM | POA: Diagnosis not present

## 2020-02-18 DIAGNOSIS — N1832 Chronic kidney disease, stage 3b: Secondary | ICD-10-CM | POA: Diagnosis not present

## 2020-02-18 DIAGNOSIS — R600 Localized edema: Secondary | ICD-10-CM | POA: Diagnosis not present
